# Patient Record
Sex: Female | Born: 1940
Health system: Southern US, Community
[De-identification: ages and names within clinical notes are randomized; demographics above are authoritative.]

## PROBLEM LIST (undated history)

## (undated) DIAGNOSIS — E538 Deficiency of other specified B group vitamins: Secondary | ICD-10-CM

## (undated) HISTORY — DX: Deficiency of other specified B group vitamins: E53.8

## (undated) HISTORY — PX: NO PAST SURGERIES: SHX2092

---

## 2002-05-25 ENCOUNTER — Ambulatory Visit (HOSPITAL_COMMUNITY): Admission: RE | Admit: 2002-05-25 | Discharge: 2002-05-25 | Payer: Self-pay | Admitting: *Deleted

## 2006-08-16 ENCOUNTER — Other Ambulatory Visit: Admission: RE | Admit: 2006-08-16 | Discharge: 2006-08-16 | Payer: Self-pay | Admitting: *Deleted

## 2009-01-07 ENCOUNTER — Encounter: Admission: RE | Admit: 2009-01-07 | Discharge: 2009-01-07 | Payer: Self-pay | Admitting: Obstetrics and Gynecology

## 2009-02-25 ENCOUNTER — Encounter: Admission: RE | Admit: 2009-02-25 | Discharge: 2009-02-25 | Payer: Self-pay | Admitting: General Surgery

## 2009-12-24 ENCOUNTER — Encounter: Admission: RE | Admit: 2009-12-24 | Discharge: 2009-12-24 | Payer: Self-pay | Admitting: Family Medicine

## 2011-01-13 ENCOUNTER — Encounter
Admission: RE | Admit: 2011-01-13 | Discharge: 2011-01-13 | Payer: Self-pay | Source: Home / Self Care | Attending: Family Medicine | Admitting: Family Medicine

## 2011-12-08 ENCOUNTER — Other Ambulatory Visit: Payer: Self-pay | Admitting: Family Medicine

## 2011-12-08 DIAGNOSIS — Z1231 Encounter for screening mammogram for malignant neoplasm of breast: Secondary | ICD-10-CM

## 2012-01-17 ENCOUNTER — Ambulatory Visit
Admission: RE | Admit: 2012-01-17 | Discharge: 2012-01-17 | Disposition: A | Discharge status: Home / Self Care | Payer: Medicare Other | Source: Ambulatory Visit | Attending: Family Medicine | Admitting: Family Medicine

## 2012-01-17 DIAGNOSIS — Z1231 Encounter for screening mammogram for malignant neoplasm of breast: Secondary | ICD-10-CM

## 2012-02-10 ENCOUNTER — Other Ambulatory Visit: Payer: Self-pay | Admitting: Oncology

## 2012-09-08 ENCOUNTER — Other Ambulatory Visit: Payer: Self-pay | Admitting: Oncology

## 2012-12-28 ENCOUNTER — Other Ambulatory Visit: Payer: Self-pay | Admitting: Family Medicine

## 2012-12-28 DIAGNOSIS — Z1231 Encounter for screening mammogram for malignant neoplasm of breast: Secondary | ICD-10-CM

## 2013-01-29 ENCOUNTER — Ambulatory Visit
Admission: RE | Admit: 2013-01-29 | Discharge: 2013-01-29 | Disposition: A | Discharge status: Home / Self Care | Payer: Medicare Other | Source: Ambulatory Visit | Attending: Family Medicine | Admitting: Family Medicine

## 2013-01-29 DIAGNOSIS — Z1231 Encounter for screening mammogram for malignant neoplasm of breast: Secondary | ICD-10-CM

## 2013-12-26 ENCOUNTER — Other Ambulatory Visit: Payer: Self-pay

## 2013-12-26 DIAGNOSIS — Z1231 Encounter for screening mammogram for malignant neoplasm of breast: Secondary | ICD-10-CM

## 2014-01-30 ENCOUNTER — Ambulatory Visit
Admission: RE | Admit: 2014-01-30 | Discharge: 2014-01-30 | Disposition: A | Discharge status: Home / Self Care | Payer: 59 | Source: Ambulatory Visit

## 2014-01-30 DIAGNOSIS — Z1231 Encounter for screening mammogram for malignant neoplasm of breast: Secondary | ICD-10-CM

## 2014-12-09 ENCOUNTER — Other Ambulatory Visit: Payer: Self-pay

## 2014-12-09 DIAGNOSIS — Z1231 Encounter for screening mammogram for malignant neoplasm of breast: Secondary | ICD-10-CM

## 2015-02-04 ENCOUNTER — Ambulatory Visit: Payer: 59

## 2015-02-06 ENCOUNTER — Ambulatory Visit
Admission: RE | Admit: 2015-02-06 | Discharge: 2015-02-06 | Disposition: A | Discharge status: Home / Self Care | Payer: Medicare Other | Source: Ambulatory Visit

## 2015-02-06 DIAGNOSIS — Z1231 Encounter for screening mammogram for malignant neoplasm of breast: Secondary | ICD-10-CM

## 2015-11-17 ENCOUNTER — Other Ambulatory Visit: Payer: Self-pay

## 2015-11-17 DIAGNOSIS — Z1231 Encounter for screening mammogram for malignant neoplasm of breast: Secondary | ICD-10-CM

## 2016-02-23 ENCOUNTER — Ambulatory Visit
Admission: RE | Admit: 2016-02-23 | Discharge: 2016-02-23 | Disposition: A | Discharge status: Home / Self Care | Payer: Medicare Other | Source: Ambulatory Visit

## 2016-02-23 DIAGNOSIS — Z1231 Encounter for screening mammogram for malignant neoplasm of breast: Secondary | ICD-10-CM

## 2016-02-25 ENCOUNTER — Other Ambulatory Visit: Payer: Self-pay | Admitting: Family Medicine

## 2016-02-25 DIAGNOSIS — R928 Other abnormal and inconclusive findings on diagnostic imaging of breast: Secondary | ICD-10-CM

## 2016-03-03 ENCOUNTER — Ambulatory Visit
Admission: RE | Admit: 2016-03-03 | Discharge: 2016-03-03 | Disposition: A | Discharge status: Home / Self Care | Payer: Medicare Other | Source: Ambulatory Visit | Attending: Family Medicine | Admitting: Family Medicine

## 2016-03-03 DIAGNOSIS — R928 Other abnormal and inconclusive findings on diagnostic imaging of breast: Secondary | ICD-10-CM

## 2016-08-30 ENCOUNTER — Encounter: Payer: Self-pay | Admitting: Podiatry

## 2016-08-30 ENCOUNTER — Ambulatory Visit (INDEPENDENT_AMBULATORY_CARE_PROVIDER_SITE_OTHER): Payer: Medicare Other

## 2016-08-30 ENCOUNTER — Ambulatory Visit (INDEPENDENT_AMBULATORY_CARE_PROVIDER_SITE_OTHER): Payer: Medicare Other | Admitting: Podiatry

## 2016-08-30 VITALS — BP 119/73 | HR 78 | Resp 16 | Ht 61.0 in | Wt 125.0 lb

## 2016-08-30 DIAGNOSIS — L84 Corns and callosities: Secondary | ICD-10-CM | POA: Diagnosis not present

## 2016-08-30 DIAGNOSIS — M79671 Pain in right foot: Secondary | ICD-10-CM | POA: Diagnosis not present

## 2016-08-30 DIAGNOSIS — D169 Benign neoplasm of bone and articular cartilage, unspecified: Secondary | ICD-10-CM | POA: Diagnosis not present

## 2016-08-30 NOTE — Progress Notes (Signed)
   Subjective:    Patient ID: Shannon Whitaker, female    DOB: 1941/06/10, 75 y.o.   MRN: [id number]  HPI  Chief Complaint  Patient presents with  . Toe Pain    R 5th toe painful lesion rubbing 4th toe.         Review of Systems  All other systems reviewed and are negative.      Objective:   Physical Exam        Assessment & Plan:

## 2016-08-31 NOTE — Progress Notes (Signed)
Subjective:     Patient ID: Shannon Whitaker, female   DOB: Apr 09, 1941, 75 y.o.   MRN: [id number]  HPI patient presents stating she has a painful lesion on the right fifth digit the makes it hard to wear shoe gear and she knows the toe is rotated she had one fixed on her left and she no she needs this one done at one point in future   Review of Systems  All other systems reviewed and are negative.      Objective:   Physical Exam  Constitutional: She is oriented to person, place, and time.  Cardiovascular: Intact distal pulses.   Abdominal: Rebound: neurovascular status intact muscle strength adequate range of motion within normal limits with patient not to have a distal medial keratotic lesion right fifth toe that's painful when pressed with slight rotation of the toe against the fourth toe. Patient   Musculoskeletal: Normal range of motion.  Neurological: She is oriented to person, place, and time.  Skin: Skin is warm.  Nursing note and vitals reviewed.  states it's gradually getting worse and was noted to have good digital perfusion and is well oriented 3     Assessment:     Exostotic lesion fifth digit right with also patient noted to have rotated fifth toe    Plan:     H&P x-rays reviewed and debridement accomplished with padding. Discussed ultimate exostectomy and reviewed procedure and recovery and she wants to get this done but needs to wait several months to do this  X-ray report indicated rotation of the fifth toe significant nature with distal medial keratotic tissue and exostotic lesion

## 2017-01-25 ENCOUNTER — Other Ambulatory Visit: Payer: Self-pay | Admitting: Family Medicine

## 2017-01-25 DIAGNOSIS — Z1231 Encounter for screening mammogram for malignant neoplasm of breast: Secondary | ICD-10-CM

## 2017-02-25 ENCOUNTER — Ambulatory Visit
Admission: RE | Admit: 2017-02-25 | Discharge: 2017-02-25 | Disposition: A | Discharge status: Home / Self Care | Payer: Medicare Other | Source: Ambulatory Visit | Attending: Family Medicine | Admitting: Family Medicine

## 2017-02-25 DIAGNOSIS — Z1231 Encounter for screening mammogram for malignant neoplasm of breast: Secondary | ICD-10-CM

## 2017-05-13 ENCOUNTER — Ambulatory Visit (INDEPENDENT_AMBULATORY_CARE_PROVIDER_SITE_OTHER): Payer: Medicare Other | Admitting: Podiatry

## 2017-05-13 ENCOUNTER — Encounter: Payer: Self-pay | Admitting: Podiatry

## 2017-05-13 DIAGNOSIS — L84 Corns and callosities: Secondary | ICD-10-CM | POA: Diagnosis not present

## 2017-05-13 DIAGNOSIS — D169 Benign neoplasm of bone and articular cartilage, unspecified: Secondary | ICD-10-CM | POA: Diagnosis not present

## 2017-05-14 NOTE — Progress Notes (Signed)
Subjective:    Patient ID: Shannon Whitaker, female   DOB: 76 y.o.   MRN: [id number]   HPI patient states what she did for me last time was quite a bit better but I'm still having discomfort especially when I wear tighter shoes not as bad now been wearing sandals    ROS      Objective:  Physical Exam neurovascular status intact with patient's fifth digit right showing medial keratotic lesion that's painful and several other lesions that are painful on both feet with an irritated fourth nail medial side     Assessment:    Keratotic lesion digit 5 right with exostotic lesion and ingrown toenail left fourth toe     Plan:    H&P conditions reviewed debridement accomplished removed a small bit at the medial nail and may require permanent procedure if it persists or returns and will be seen back and understands someday she may need exostectomy fifth digit right which I educated her on

## 2017-12-02 ENCOUNTER — Other Ambulatory Visit: Payer: Self-pay | Admitting: Family Medicine

## 2017-12-02 DIAGNOSIS — Z1231 Encounter for screening mammogram for malignant neoplasm of breast: Secondary | ICD-10-CM

## 2017-12-23 DIAGNOSIS — M25562 Pain in left knee: Secondary | ICD-10-CM | POA: Insufficient documentation

## 2018-02-08 ENCOUNTER — Ambulatory Visit: Payer: Medicare Other | Admitting: Podiatry

## 2018-02-08 ENCOUNTER — Encounter: Payer: Self-pay | Admitting: Podiatry

## 2018-02-08 ENCOUNTER — Ambulatory Visit (INDEPENDENT_AMBULATORY_CARE_PROVIDER_SITE_OTHER): Payer: Medicare Other

## 2018-02-08 DIAGNOSIS — L84 Corns and callosities: Secondary | ICD-10-CM

## 2018-02-08 DIAGNOSIS — M2041 Other hammer toe(s) (acquired), right foot: Secondary | ICD-10-CM

## 2018-02-08 DIAGNOSIS — D169 Benign neoplasm of bone and articular cartilage, unspecified: Secondary | ICD-10-CM | POA: Diagnosis not present

## 2018-02-08 NOTE — Progress Notes (Signed)
Subjective:   Patient ID: Shannon Whitaker, female   DOB: 77 y.o.   MRN: [id number]   HPI Patient presents stating that she is developed corns fourth and fifth digits right foot which are sore and she is not sure if she is getting need surgery   ROS      Objective:  Physical Exam  Neurovascular status intact with digital deformities digits 4 5 right with exostotic lesion hammertoe deformity and chronic keratotic tissue formation     Assessment:  Hammertoe deformity exostosis with chronic lesion formation     Plan:  H&P condition reviewed and today sharp debridement accomplished with no iatrogenic bleeding noted.  Patient had padding applied and we discussed hammertoe arthroplasty and exostectomy which may be necessary and patient will return when it is sore and at this time the left foot does not require any form of aggressive treatment  X-rays indicate significant structural malalignment of the fifth digit bilateral pressing against the fourth toe bilateral

## 2018-02-27 ENCOUNTER — Ambulatory Visit
Admission: RE | Admit: 2018-02-27 | Discharge: 2018-02-27 | Disposition: A | Discharge status: Home / Self Care | Payer: Medicare Other | Source: Ambulatory Visit | Attending: Family Medicine | Admitting: Family Medicine

## 2018-02-27 DIAGNOSIS — Z1231 Encounter for screening mammogram for malignant neoplasm of breast: Secondary | ICD-10-CM

## 2018-04-28 ENCOUNTER — Other Ambulatory Visit: Payer: Self-pay | Admitting: Orthopedic Surgery

## 2018-04-28 DIAGNOSIS — M79662 Pain in left lower leg: Secondary | ICD-10-CM

## 2018-05-02 NOTE — Progress Notes (Signed)
Phone call to patient to verify medication list and allergies. Pt reports she occasionally takes trazodone for sleeping. Pt informed she will need to stop Trazodone 48hrs prior to myelogram appointment time. Pt verbalized understanding.

## 2018-05-16 ENCOUNTER — Other Ambulatory Visit: Payer: Medicare Other

## 2018-05-19 ENCOUNTER — Ambulatory Visit
Admission: RE | Admit: 2018-05-19 | Discharge: 2018-05-19 | Disposition: A | Discharge status: Home / Self Care | Payer: Medicare Other | Source: Ambulatory Visit | Attending: Orthopedic Surgery | Admitting: Orthopedic Surgery

## 2018-05-19 DIAGNOSIS — M79662 Pain in left lower leg: Secondary | ICD-10-CM

## 2018-05-19 MED ORDER — DIAZEPAM 5 MG PO TABS: 5.0000 mg | ORAL_TABLET | Freq: Once | ORAL | Status: DC

## 2018-05-19 MED ORDER — IOPAMIDOL (ISOVUE-M 200) INJECTION 41%
15.0000 mL | Freq: Once | INTRAMUSCULAR | Status: AC
  Administered 2018-05-19: 15 mL via INTRATHECAL

## 2018-05-19 NOTE — Discharge Instructions (Signed)
Myelogram Discharge Instructions  1. Go home and rest quietly for the next 24 hours.  It is important to lie flat for the next 24 hours.  Get up only to go to the restroom.  You may lie in the bed or on a couch on your back, your stomach, your left side or your right side.  You may have one pillow under your head.  You may have pillows between your knees while you are on your side or under your knees while you are on your back.  2. DO NOT drive today.  Recline the seat as far back as it will go, while still wearing your seat belt, on the way home.  3. You may get up to go to the bathroom as needed.  You may sit up for 10 minutes to eat.  You may resume your normal diet and medications unless otherwise indicated.  Drink lots of extra fluids today and tomorrow.  4. The incidence of headache, nausea, or vomiting is about 5% (one in 20 patients).  If you develop a headache, lie flat and drink plenty of fluids until the headache goes away.  Caffeinated beverages may be helpful.  If you develop severe nausea and vomiting or a headache that does not go away with flat bed rest, call [phone].  5. You may resume normal activities after your 24 hours of bed rest is over; however, do not exert yourself strongly or do any heavy lifting tomorrow. If when you get up you have a headache when standing, go back to bed and force fluids for another 24 hours.  6. Call your physician for a follow-up appointment.  The results of your myelogram will be sent directly to your physician by the following day.  7. If you have any questions or if complications develop after you arrive home, please call [phone].  Discharge instructions have been explained to the patient.  The patient, or the person responsible for the patient, fully understands these instructions.       MAY RESUME TRAZODONE ON May 20, 2018, AFTER 9:30 AM.

## 2018-12-07 ENCOUNTER — Ambulatory Visit: Payer: Medicare Other | Admitting: Podiatry

## 2018-12-07 DIAGNOSIS — B351 Tinea unguium: Secondary | ICD-10-CM | POA: Diagnosis not present

## 2018-12-07 DIAGNOSIS — L84 Corns and callosities: Secondary | ICD-10-CM | POA: Diagnosis not present

## 2018-12-07 DIAGNOSIS — M79675 Pain in left toe(s): Secondary | ICD-10-CM

## 2018-12-07 DIAGNOSIS — M79674 Pain in right toe(s): Secondary | ICD-10-CM | POA: Diagnosis not present

## 2018-12-07 NOTE — Patient Instructions (Signed)
Corns and Calluses Corns are small areas of thickened skin that occur on the top, sides, or tip of a toe. They contain a cone-shaped core with a point that can press on a nerve below. This causes pain.  Calluses are areas of thickened skin that can occur anywhere on the body, including the hands, fingers, palms, soles of the feet, and heels. Calluses are usually larger than corns. What are the causes? Corns and calluses are caused by rubbing (friction) or pressure, such as from shoes that are too tight or do not fit properly. What increases the risk? Corns are more likely to develop in people who have misshapen toes (toe deformities), such as hammer toes. Calluses can occur with friction to any area of the skin. They are more likely to develop in people who:  Work with their hands.  Wear shoes that fit poorly, are too tight, or are high-heeled.  Have toe deformities. What are the signs or symptoms? Symptoms of a corn or callus include:  A hard growth on the skin.  Pain or tenderness under the skin.  Redness and swelling.  Increased discomfort while wearing tight-fitting shoes, if your feet are affected. If a corn or callus becomes infected, symptoms may include:  Redness and swelling that gets worse.  Pain.  Fluid, blood, or pus draining from the corn or callus. How is this diagnosed? Corns and calluses may be diagnosed based on your symptoms, your medical history, and a physical exam. How is this treated? Treatment for corns and calluses may include:  Removing the cause of the friction or pressure. This may involve: ? Changing your shoes. ? Wearing shoe inserts (orthotics) or other protective layers in your shoes, such as a corn pad. ? Wearing gloves.  Applying medicine to the skin (topical medicine) to help soften skin in the hardened, thickened areas.  Removing layers of dead skin with a file to reduce the size of the corn or callus.  Removing the corn or callus with a  scalpel or laser.  Taking antibiotic medicines, if your corn or callus is infected.  Having surgery, if a toe deformity is the cause. Follow these instructions at home:   Take over-the-counter and prescription medicines only as told by your health care provider.  If you were prescribed an antibiotic, take it as told by your health care provider. Do not stop taking it even if your condition starts to improve.  Wear shoes that fit well. Avoid wearing high-heeled shoes and shoes that are too tight or too loose.  Wear any padding, protective layers, gloves, or orthotics as told by your health care provider.  Soak your hands or feet and then use a file or pumice stone to soften your corn or callus. Do this as told by your health care provider.  Check your corn or callus every day for symptoms of infection. Contact a health care provider if you:  Notice that your symptoms do not improve with treatment.  Have redness or swelling that gets worse.  Notice that your corn or callus becomes painful.  Have fluid, blood, or pus coming from your corn or callus.  Have new symptoms. Summary  Corns are small areas of thickened skin that occur on the top, sides, or tip of a toe.  Calluses are areas of thickened skin that can occur anywhere on the body, including the hands, fingers, palms, and soles of the feet. Calluses are usually larger than corns.  Corns and calluses are caused by   rubbing (friction) or pressure, such as from shoes that are too tight or do not fit properly.  Treatment may include wearing any padding, protective layers, gloves, or orthotics as told by your health care provider. This information is not intended to replace advice given to you by your health care provider. Make sure you discuss any questions you have with your health care provider. Document Released: 09/11/2004 Document Revised: 10/19/2017 Document Reviewed: 10/19/2017 Elsevier Interactive Patient Education   2019 Elsevier Inc.  Onychomycosis/Fungal Toenails  WHAT IS IT? An infection that lies within the keratin of your nail plate that is caused by a fungus.  WHY ME? Fungal infections affect all ages, sexes, races, and creeds.  There may be many factors that predispose you to a fungal infection such as age, coexisting medical conditions such as diabetes, or an autoimmune disease; stress, medications, fatigue, genetics, etc.  Bottom line: fungus thrives in a warm, moist environment and your shoes offer such a location.  IS IT CONTAGIOUS? Theoretically, yes.  You do not want to share shoes, nail clippers or files with someone who has fungal toenails.  Walking around barefoot in the same room or sleeping in the same bed is unlikely to transfer the organism.  It is important to realize, however, that fungus can spread easily from one nail to the next on the same foot.  HOW DO WE TREAT THIS?  There are several ways to treat this condition.  Treatment may depend on many factors such as age, medications, pregnancy, liver and kidney conditions, etc.  It is best to ask your doctor which options are available to you.  1. No treatment.   Unlike many other medical concerns, you can live with this condition.  However for many people this can be a painful condition and may lead to ingrown toenails or a bacterial infection.  It is recommended that you keep the nails cut short to help reduce the amount of fungal nail. 2. Topical treatment.  These range from herbal remedies to prescription strength nail lacquers.  About 40-50% effective, topicals require twice daily application for approximately 9 to 12 months or until an entirely new nail has grown out.  The most effective topicals are medical grade medications available through physicians offices. 3. Oral antifungal medications.  With an 80-90% cure rate, the most common oral medication requires 3 to 4 months of therapy and stays in your system for a year as the new nail  grows out.  Oral antifungal medications do require blood work to make sure it is a safe drug for you.  A liver function panel will be performed prior to starting the medication and after the first month of treatment.  It is important to have the blood work performed to avoid any harmful side effects.  In general, this medication safe but blood work is required. 4. Laser Therapy.  This treatment is performed by applying a specialized laser to the affected nail plate.  This therapy is noninvasive, fast, and non-painful.  It is not covered by insurance and is therefore, out of pocket.  The results have been very good with a 80-95% cure rate.  The [facility] is the only practice in the area to offer this therapy. 5. Permanent Nail Avulsion.  Removing the entire nail so that a new nail will not grow back. 

## 2019-01-11 ENCOUNTER — Encounter: Payer: Self-pay | Admitting: Podiatry

## 2019-01-11 NOTE — Progress Notes (Signed)
Subjective:  Patient presents to clinic with cc of  painful, thick, discolored, elongated toenails 1-5 b/l that become tender and cannot cut because of thickness. Also c/o  painful corns right 4th and 5th digits which are relieved with periodic debridement.  She has discussed surgical options with Dr. Paulla Dolly, but is not ready to proceed yet.  Lawerance Cruel, MD is her PCP.   Current Outpatient Medications:  .  Cyanocobalamin (VITAMIN B-12 IJ), Inject as directed. Gets an injection once a month, Disp: , Rfl:    No Known Allergies   Objective:  Physical Examination: Neurovascular status intact with thick, discolored brittle toenails 1-5 b/l with tenderness to palpation of nail plates.  Adductovarus rotation of 5th digit right distal phalanx with hyperkeratotic lesion noted at medial aspect of digit. Hyperkeratotic lesion lateral aspect of 4th digit PIPJ which abuts 5th digit lesion. Both with tenderness to palpation. No flocculence, no edema, no erythema, no underlying wound.   Assessment: 1. Mycotic nail infection with pain 1-5 b/l 2. 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 Hammertoe 5th digit  Plan: 1. Debride painful toenails 1-5 b/l with no iatrogenic bleeding. 2. Pared painful corns right 4th and 5th digits. Dispensed Silicone toe pads for comfort. Avoid shoe gear with tapered forefoot. 3. Follow up prn

## 2019-01-18 ENCOUNTER — Other Ambulatory Visit: Payer: Self-pay | Admitting: Family Medicine

## 2019-01-18 DIAGNOSIS — Z1231 Encounter for screening mammogram for malignant neoplasm of breast: Secondary | ICD-10-CM

## 2019-03-05 ENCOUNTER — Ambulatory Visit: Payer: Medicare Other

## 2019-03-28 ENCOUNTER — Ambulatory Visit: Payer: Medicare Other

## 2019-05-10 ENCOUNTER — Other Ambulatory Visit: Payer: Self-pay

## 2019-05-10 ENCOUNTER — Ambulatory Visit
Admission: RE | Admit: 2019-05-10 | Discharge: 2019-05-10 | Disposition: A | Discharge status: Home / Self Care | Payer: Medicare Other | Source: Ambulatory Visit | Attending: Family Medicine | Admitting: Family Medicine

## 2019-05-10 DIAGNOSIS — Z1231 Encounter for screening mammogram for malignant neoplasm of breast: Secondary | ICD-10-CM

## 2019-05-15 ENCOUNTER — Other Ambulatory Visit: Payer: Self-pay | Admitting: Family Medicine

## 2019-05-15 DIAGNOSIS — R928 Other abnormal and inconclusive findings on diagnostic imaging of breast: Secondary | ICD-10-CM

## 2019-05-18 ENCOUNTER — Ambulatory Visit: Payer: Medicare Other

## 2019-05-18 ENCOUNTER — Other Ambulatory Visit: Payer: Self-pay

## 2019-05-18 ENCOUNTER — Ambulatory Visit
Admission: RE | Admit: 2019-05-18 | Discharge: 2019-05-18 | Disposition: A | Discharge status: Home / Self Care | Payer: Medicare Other | Source: Ambulatory Visit | Attending: Family Medicine | Admitting: Family Medicine

## 2019-05-18 DIAGNOSIS — R928 Other abnormal and inconclusive findings on diagnostic imaging of breast: Secondary | ICD-10-CM

## 2020-02-20 DIAGNOSIS — L821 Other seborrheic keratosis: Secondary | ICD-10-CM | POA: Diagnosis not present

## 2020-02-20 DIAGNOSIS — D225 Melanocytic nevi of trunk: Secondary | ICD-10-CM | POA: Diagnosis not present

## 2020-02-20 DIAGNOSIS — L820 Inflamed seborrheic keratosis: Secondary | ICD-10-CM | POA: Diagnosis not present

## 2020-02-20 DIAGNOSIS — Z85828 Personal history of other malignant neoplasm of skin: Secondary | ICD-10-CM | POA: Diagnosis not present

## 2020-04-01 DIAGNOSIS — M25562 Pain in left knee: Secondary | ICD-10-CM | POA: Diagnosis not present

## 2020-05-05 ENCOUNTER — Other Ambulatory Visit: Payer: Self-pay | Admitting: Family Medicine

## 2020-05-05 DIAGNOSIS — L03116 Cellulitis of left lower limb: Secondary | ICD-10-CM | POA: Diagnosis not present

## 2020-05-05 DIAGNOSIS — Z1231 Encounter for screening mammogram for malignant neoplasm of breast: Secondary | ICD-10-CM

## 2020-05-12 ENCOUNTER — Ambulatory Visit: Payer: Medicare Other

## 2020-05-14 ENCOUNTER — Ambulatory Visit
Admission: RE | Admit: 2020-05-14 | Discharge: 2020-05-14 | Disposition: A | Discharge status: Home / Self Care | Payer: Medicare PPO | Source: Ambulatory Visit | Attending: Family Medicine | Admitting: Family Medicine

## 2020-05-14 ENCOUNTER — Other Ambulatory Visit: Payer: Self-pay

## 2020-05-14 DIAGNOSIS — Z1231 Encounter for screening mammogram for malignant neoplasm of breast: Secondary | ICD-10-CM

## 2020-06-27 DIAGNOSIS — M533 Sacrococcygeal disorders, not elsewhere classified: Secondary | ICD-10-CM | POA: Diagnosis not present

## 2020-08-20 DIAGNOSIS — K1379 Other lesions of oral mucosa: Secondary | ICD-10-CM | POA: Diagnosis not present

## 2020-08-20 DIAGNOSIS — R0981 Nasal congestion: Secondary | ICD-10-CM | POA: Diagnosis not present

## 2020-08-30 DIAGNOSIS — R030 Elevated blood-pressure reading, without diagnosis of hypertension: Secondary | ICD-10-CM | POA: Diagnosis not present

## 2020-08-30 DIAGNOSIS — Z604 Social exclusion and rejection: Secondary | ICD-10-CM | POA: Diagnosis not present

## 2020-08-30 DIAGNOSIS — Z87891 Personal history of nicotine dependence: Secondary | ICD-10-CM | POA: Diagnosis not present

## 2020-08-30 DIAGNOSIS — Z823 Family history of stroke: Secondary | ICD-10-CM | POA: Diagnosis not present

## 2020-08-30 DIAGNOSIS — Z809 Family history of malignant neoplasm, unspecified: Secondary | ICD-10-CM | POA: Diagnosis not present

## 2020-08-30 DIAGNOSIS — Z8249 Family history of ischemic heart disease and other diseases of the circulatory system: Secondary | ICD-10-CM | POA: Diagnosis not present

## 2020-09-03 DIAGNOSIS — J329 Chronic sinusitis, unspecified: Secondary | ICD-10-CM | POA: Diagnosis not present

## 2020-09-03 DIAGNOSIS — J029 Acute pharyngitis, unspecified: Secondary | ICD-10-CM | POA: Diagnosis not present

## 2020-09-24 DIAGNOSIS — J029 Acute pharyngitis, unspecified: Secondary | ICD-10-CM | POA: Diagnosis not present

## 2020-10-18 DIAGNOSIS — M545 Low back pain, unspecified: Secondary | ICD-10-CM | POA: Diagnosis not present

## 2020-10-30 DIAGNOSIS — Z961 Presence of intraocular lens: Secondary | ICD-10-CM | POA: Diagnosis not present

## 2020-12-04 DIAGNOSIS — J029 Acute pharyngitis, unspecified: Secondary | ICD-10-CM | POA: Diagnosis not present

## 2020-12-04 DIAGNOSIS — R0982 Postnasal drip: Secondary | ICD-10-CM | POA: Diagnosis not present

## 2020-12-25 DIAGNOSIS — J029 Acute pharyngitis, unspecified: Secondary | ICD-10-CM | POA: Diagnosis not present

## 2021-03-30 DIAGNOSIS — E538 Deficiency of other specified B group vitamins: Secondary | ICD-10-CM | POA: Diagnosis not present

## 2021-03-30 DIAGNOSIS — R413 Other amnesia: Secondary | ICD-10-CM | POA: Diagnosis not present

## 2021-04-29 ENCOUNTER — Other Ambulatory Visit: Payer: Self-pay | Admitting: Family Medicine

## 2021-04-29 DIAGNOSIS — Z1231 Encounter for screening mammogram for malignant neoplasm of breast: Secondary | ICD-10-CM

## 2021-05-12 DIAGNOSIS — Z604 Social exclusion and rejection: Secondary | ICD-10-CM | POA: Diagnosis not present

## 2021-05-12 DIAGNOSIS — R413 Other amnesia: Secondary | ICD-10-CM | POA: Diagnosis not present

## 2021-05-12 DIAGNOSIS — E538 Deficiency of other specified B group vitamins: Secondary | ICD-10-CM | POA: Diagnosis not present

## 2021-06-10 ENCOUNTER — Encounter: Payer: Self-pay | Admitting: Neurology

## 2021-06-10 ENCOUNTER — Ambulatory Visit: Payer: Medicare PPO | Admitting: Neurology

## 2021-06-10 VITALS — BP 123/72 | HR 87 | Ht 62.0 in | Wt 99.0 lb

## 2021-06-10 DIAGNOSIS — G3184 Mild cognitive impairment, so stated: Secondary | ICD-10-CM

## 2021-06-10 NOTE — Patient Instructions (Addendum)
MRI of the brain Blood work Formal memory testing for a baseline   Recommendations to prevent or slow progression of cognitive decline:   Exercise You should increase exercise 30 to 45 minutes per day at least 3 days a week although 5 to 7 would be preferred. Any type of exercise (including walking) is acceptable although a recumbent bicycle may be best if you are unsteady. Disease related apathy can be a significant roadblock to exercise and the only way to overcome this is to make it a daily routine and perhaps have a reward at the end (something your loved one loves to eat or drink perhaps) or a personal trainer coming to the home can also be very useful. In general a structured, repetitive schedule is best.   Cardiovascular Health: You should optimize all cardiovascular risk factors (blood pressure, sugar, cholesterol) as vascular disease such as strokes and heart attacks can make memory problems much worse.   Diet: Eating a heart healthy (Mediterranean) diet is also a good idea; fish and poultry instead of red meat, nuts (mostly non-peanuts), vegetables, fruits, olive oil or canola oil (instead of butter), minimal salt (use other spices to flavor foods), whole grain rice, bread, cereal and pasta and wine in moderation.  General Health: Any diseases which effect your body will effect your brain such as a pneumonia, urinary infection, blood clot, heart attack or stroke. Keep contact with your primary care doctor for regular follow ups.  Sleep. A good nights sleep is healthy for the brain. Seven hours is recommended. If you have insomnia or poor sleep habits see the recommendations below  Tips: Structured and consistent daytime and nighttime routine, including regular wake times, bedtimes, and mealtimes, will be important for the patient to avoid confusion. Keeping frequently used items in designated places will help reduce stress from searching. If there are worries about getting lost do not let  the patient leave home unaccompanied. They might benefit from wearing an identification bracelet that will help others assist in finding home if they become lost. Information about nationwide safe return services and other helpful resources may be obtained through the Alzheimer's Association helpline at [phone].  Finances, Power of Producer, television/film/video Directives: You should consider putting legal safeguards in place with regard to financial and medical decision making. While the spouse always has power of attorney for medical and financial issues in the absence of any form, you should consider what you want in case the spouse / caregiver is no longer around or capable of making decisions.   [place] : http://www.welch.com/.pdf  Or Google "Carrier" AND "An Forensic scientist for [employer]  Other States: ApartmentMom.com.ee  The signature on these forms should be notarized.   DRIVING:   Recommend driving evaluation  If you would like to be tested to see if you are driving safely, [facility] has a Clinical Driving Evaluation. To schedule an appointment call [phone].                RESOURCES:  Memory Loss: Improve your short term memory By Silvio Pate  The Alzheimer's Reading Room http://www.alzheimersreadingroom.[email or web address]/   The Alzheimer's Compendium http://www.alzcompend.info/  [employer] www.dukefamilysupport.WER [phone]  Recommended resources for caregivers (All can be purchased on [employer]):  1) A Caregiver's Guide to Dementia: Using Activities and Other Strategies to Prevent, Reduce and Manage Behavioral Symptoms by Osie Bond. Gitlin and [employer]   2) A Caregiver's Guide to [employer] Dementia by Caleen Essex MS BSN  and Gaston Islam   3) What If It's Not Alzheimer's?: A Caregiver's  Guide to Dementia by Koren Shiver (Author), Octaviano Batty (Editor)  3) The 36 hour day by Rabins and Mace  4) Understanding Difficult Behaviors by Merita Norton and White  Online course for helping caregivers reduce stress, guilt and frustration called the Caregivers Helpbook. The website is www.powerfultoolsforcaregivers.org  As a caregiver you are a Art gallery manager. Problems you face as a caregiver are usually unique to your situation and the way your loved-one's disease manifests itself. The best way to use these books is to look at the Table of Contents and read any chapters of interest or that apply to challenges you are having as a caregiver.  NATIONAL RESOURCES: For more information on neurological disorders or research programs funded by the [employer] of Neurological Disorders and Stroke, contact the Institute's Agricultural consultant (BRAIN) at: [place] [address] [phone] (toll-free) MasterBoxes.it  Information on dementia is also available from the following organizations: Alzheimer's Disease Education and Referral ([place]) [facility] [address] [place] [address] [phone] (toll-free) DVDEnthusiasts.nl  Alzheimer's Association [address], [place] [address] [phone] (toll-free, 24-hour helpline) [phone] (TDD) CapitalMile.co.nz  Alzheimer's Foundation of America [address], [place] [address] [phone] (toll-free) www.alzfdn.org  [facility] [address], [place] [address] [phone] www.alzdiscovery.org  Association for [address], [place] [address] [phone] (toll-free) [place]  [address] [phone] (toll-free) www.brightfocus.org/alzheimers  [facility] Stabler [facility] [address] [place] [address] [phone] www.https://lambert-jackson.net/  Lewy Body Dementia Association [address] [place] [address] [phone] (toll-free LBD Caregiver Link) www.lbda.[place], [place], [place] [phone] (toll-free) [phone] [facility]) https://carter.com/  National Organization for Rare Disorders [address] [place] [address] 7-124-580-DXIP [phone]) (toll-free) www.rarediseases.org  The Dementias: Hope Through Research was jointly produced by the [employer] of Neurological Disorders and Stroke (NINDS) and the [employer] on Aging (NIA), both part of the [employer], the [employer] research agency--supporting scientific studies that turn discovery into health. NINDS is the nation's leading funder of research on the brain and nervous system. The NINDS mission is to reduce the burden of neurological disease. For more information and resources, visit MasterBoxes.it [1] or call 6067815150. NIA leads the federal government effort conducting and supporting research on aging and the health and well-being of older people. NIA's Alzheimer's Disease Education and Referral (ADEAR) Center offers information and publications on dementia and caregiving for families, caregivers, and professionals. For more information, visit DVDEnthusiasts.nl [2] or call ([phone]. Also available from NIA are publications and information about Alzheimer's disease as well as the booklets Frontotemporal Disorders: Information for Patients, Families, and Caregivers and Lewy Body Dementia: Information for Patients, Families, and Professionals. Source URL:  SocialSpecialists.co.nz

## 2021-06-10 NOTE — Progress Notes (Signed)
[facility]    Provider:  Dr Jaynee Eagles Requesting Provider: Lawerance Cruel, MD Primary Care Provider:  Lawerance Cruel, MD  CC:  memory loss  HPI:  Shannon Whitaker is a 80 y.o. female here as requested by Lawerance Cruel, MD for memory loss. PMHx osteopenia, high cholesterol, vitamin B12 deficiency, pernicious anemia, memory changes, sleep disturbance, osteoarthritis, allergies and bacterial sinusitis.  I reviewed Dr. Alan Ripper notes, she is on B12 IM monthly injections.  There are concerns from her neighbor who lives next door to her, neighbor talked at great length about this patient with dementia, the neighbor seems distressed about her care, neighbor discussed his concerns with [name] and reported that patient is very forgetful, the neighbor also mentioned that patient has no family and that he can help her but is very concerned about her and feels she needs more extensive help, neighbor mentioned several times to her healthcare POA that she needs to go to the doctor and she is also not getting the help she needs, neighbor states she is driving herself to the grocery store and she does not go very far but sometimes gets to a stop sign and has trouble remembering where to go, neighbor discussed issue with Dr. Harrington Challenger verbally.  Dr. Alan Ripper office called patient and patient came in for appointment with her neighbor who is been her neighbor for 20 years, patient stated she was feeling nervous inside about leaving the house, wonders where she is going when driving, discussed taxis, reportedly repeatedly asking about varieties and, does not remember what food she eats, forgetting where stores are sometimes.  She is a former smoker quit 20 years ago, Dr. Harrington Challenger collected blood for CBC with differential, B12, CBC was normal (reviewed labs), however vitamin B12 was 159 (which is extremely decreased).  Patient is here with cousin with POA, reported she plans on moving into [place] soon, feeling  isolated and loneliness are at the root of her issues, and moving could greatly improve that, mention senior centers but she did not express interest there, Dr. Harrington Challenger helped her with some paperwork, she reported this to Dr. Harrington Challenger as well on appointment in May 2022. She says she is tired of people being in her business. She doesn't know the date or the day of the month, she taught school for 45 years, she says she will still read the paper and she knows where to look for the date and day. She is moving to [facility]. Their Aunt had dementia and lived with her and her cousin. Grandmother may have had dementia but unclear. Her cousin says she doesn't remember doctor's appointments, but more short-term memory, appointments or names or what she did recently no accidents in the home or accidents in the car, she does not know [place] like she used to, no problems driving within a few miles of home and that is all she needs. She is not missing any bills, not taking medications. She repeats the same questions but patient says she does that for emphasis and not that she forgot. She has a desk calendar and keeps all her information. She puts post-it notes everywhere.   Reviewed notes, labs and imaging from outside physicians, which showed see above  Review of Systems: Patient complains of symptoms per HPI as well as the following symptoms short-term memory loss. Pertinent negatives and positives per HPI. All others negative.   Social History   Socioeconomic History   Marital status: Single  Spouse name: Not on file   Number of children: Not on file   Years of education: Not on file   Highest education level: Not on file  Occupational History   Not on file  Tobacco Use   Smoking status: Former    Pack years: 0.00   Smokeless tobacco: Never  Substance and Sexual Activity   Alcohol use: Not on file   Drug use: Not on file   Sexual activity: Not on file  Other Topics Concern   Not on file  Social  History Narrative   Not on file   Social Determinants of Health   Financial Resource Strain: Not on file  Food Insecurity: Not on file  Transportation Needs: Not on file  Physical Activity: Not on file  Stress: Not on file  Social Connections: Not on file  Intimate Partner Violence: Not on file    Family History  Problem Relation Age of Onset   Dementia Maternal Grandmother    Dementia Other     Past Medical History:  Diagnosis Date   B12 deficiency     Patient Active Problem List   Diagnosis Date Noted   Mild cognitive impairment with memory loss 06/10/2021   Pain in left knee 12/23/2017    Past Surgical History:  Procedure Laterality Date   NO PAST SURGERIES      Current Outpatient Medications  Medication Sig Dispense Refill   Cyanocobalamin (VITAMIN B-12 IJ) Inject as directed. Gets an injection once a month     No current facility-administered medications for this visit.    Allergies as of 06/10/2021   (No Known Allergies)    Vitals: BP 123/72   Pulse 87   Ht 5\' 2"  (1.575 m)   Wt 99 lb (44.9 kg)   BMI 18.11 kg/m  Last Weight:  Wt Readings from Last 1 Encounters:  06/10/21 99 lb (44.9 kg)   Last Height:   Ht Readings from Last 1 Encounters:  06/10/21 5\' 2"  (1.575 m)     Physical exam: Exam: Gen: NAD, conversant                CV: No peripheral edema, warm, nontender Eyes: Conjunctivae clear without exudates or hemorrhage  Neuro: Detailed Neurologic Exam  Speech:    Speech is normal; fluent and spontaneous with normal comprehension.  Cognition: MMSE - Mini Mental State Exam 06/10/2021  Orientation to time 2  Orientation to Place 4  Registration 3  Attention/ Calculation 3  Recall 0  Language- name 2 objects 2  Language- repeat 1  Language- follow 3 step command 2  Language- read & follow direction 1  Write a sentence 1  Copy design 0  Total score 19    Cranial Nerves:  MMSE - Mini Mental State Exam 06/10/2021  Orientation  to time 2  Orientation to Place 4  Registration 3  Attention/ Calculation 3  Recall 0  Language- name 2 objects 2  Language- repeat 1  Language- follow 3 step command 2  Language- read & follow direction 1  Write a sentence 1  Copy design 0  Total score 19       The pupils are equal, round, and reactive to light. Tpupils too small to visiualize fundi. Visual fields are full. Extraocular movements are intact.the muscles of mastication appear  normal. The face is symmetric. Hearing intact to voice. Voice is normal.    Coordination:    Normal   Gait:    normal.  Motor Observation:    No asymmetry, no atrophy, and no involuntary movements noted. Tone:    Normal muscle tone.    Posture:    Posture is normal. normal erect    Strength:    Moving all extremities equally, no focal deficit notes     Sensation: intact to LT        Assessment/Plan:  80 y.o. female here as requested by Lawerance Cruel, MD for memory loss. PMHx osteopenia, high cholesterol, vitamin B12 deficiency, pernicious anemia, memory changes, sleep disturbance, osteoarthritis, allergies and bacterial sinusitis.  I reviewed Dr. Alan Ripper notes, she is on B12 IM monthly injections.   She is irritable, also very defensive and suspicious side of why we would like this testing. Her MMSE is 19/30.  Luckily she is moving to [facility]. Her cousin states he is very careful not to upset her because she is very defensive. She appears to have very poor insight into her memory changes and she repeats questions today and have some difficulty understanding aspects of our conversation. Slightly difficult appointment and extended.  - continue B12 injections for life.   Orders Placed This Encounter  Procedures   MR BRAIN W WO CONTRAST   Comprehensive metabolic panel   Vitamin B1   Homocysteine   Vitamin D, 25-hydroxy   TSH   Ambulatory referral to Neuropsychology     Cc: Lawerance Cruel, MD,  Lawerance Cruel,  MD  Sarina Ill, MD  [facility] [address] [place], [place] [address]  Phone [phone] Fax [phone]  I spent over 60 minutes of face-to-face and non-face-to-face time with patient on the  1. Mild cognitive impairment with memory loss    diagnosis.  This included previsit chart review, lab review, study review, order entry, electronic health record documentation, patient education on the different diagnostic and therapeutic options, counseling and coordination of care, risks and benefits of management, compliance, or risk factor reduction

## 2021-06-11 ENCOUNTER — Telehealth: Payer: Self-pay | Admitting: Neurology

## 2021-06-11 NOTE — Telephone Encounter (Signed)
-----   Message from Melvenia Beam, MD sent at 06/11/2021  8:21 AM EDT ----- Vitamin D is extremely low(17). TSH normal. Still awaiting other labs. She needs to start taking 1000-2000 daily vitamin D orally daily and follow up with Dr. Harrington Challenger non emergently. Please forward results to Dr. Harrington Challenger thanks

## 2021-06-11 NOTE — Telephone Encounter (Signed)
Called to discuss the lab results. There was no answer. LVM asking for a call back.

## 2021-06-15 ENCOUNTER — Telehealth: Payer: Self-pay | Admitting: Neurology

## 2021-06-15 DIAGNOSIS — G3184 Mild cognitive impairment, so stated: Secondary | ICD-10-CM

## 2021-06-15 LAB — COMPREHENSIVE METABOLIC PANEL
ALT: 11 IU/L (ref 0–32)
AST: 13 IU/L (ref 0–40)
Albumin/Globulin Ratio: 2.5 — ABNORMAL HIGH (ref 1.2–2.2)
Albumin: 5 g/dL — ABNORMAL HIGH (ref 3.7–4.7)
Alkaline Phosphatase: 83 IU/L (ref 44–121)
BUN/Creatinine Ratio: 16 (ref 12–28)
BUN: 11 mg/dL (ref 8–27)
Bilirubin Total: 0.8 mg/dL (ref 0.0–1.2)
CO2: 23 mmol/L (ref 20–29)
Calcium: 9.5 mg/dL (ref 8.7–10.3)
Chloride: 101 mmol/L (ref 96–106)
Creatinine, Ser: 0.67 mg/dL (ref 0.57–1.00)
Globulin, Total: 2 g/dL (ref 1.5–4.5)
Glucose: 93 mg/dL (ref 65–99)
Potassium: 4.2 mmol/L (ref 3.5–5.2)
Sodium: 142 mmol/L (ref 134–144)
Total Protein: 7 g/dL (ref 6.0–8.5)
eGFR: 89 mL/min/{1.73_m2} (ref >59)

## 2021-06-15 LAB — HOMOCYSTEINE: Homocysteine: 17.1 umol/L (ref 0.0–19.2)

## 2021-06-15 LAB — VITAMIN B1: Thiamine: 147.1 nmol/L (ref 66.5–200.0)

## 2021-06-15 LAB — VITAMIN D 25 HYDROXY (VIT D DEFICIENCY, FRACTURES): Vit D, 25-Hydroxy: 17 ng/mL — ABNORMAL LOW (ref 30.0–100.0)

## 2021-06-15 LAB — TSH: TSH: 1.44 u[IU]/mL (ref 0.450–4.500)

## 2021-06-15 NOTE — Telephone Encounter (Signed)
Pt's Shannon Whitaker (POA on DPR) called, receive message from [facility] neurology and [place] for a test. Which one do I schedule an appt with. Would like a call from the nurse.

## 2021-06-15 NOTE — Telephone Encounter (Signed)
Pt was referred to [facility] for formal memory testing. She should proceed with that and follow-up here as already scheduled for December.

## 2021-06-15 NOTE — Telephone Encounter (Signed)
Ms. Shannon Whitaker called, Shannon Whitaker would not be able to schedule her until late October. Are there any other options? If she has to wait another 4 months she will lose her room and deposit at [facility]. Would like a call from the nurse.

## 2021-06-15 NOTE — Telephone Encounter (Signed)
I called Ronald back and LVM with office number for call back. When he calls back, please let him know that Dr Jaynee Eagles referred the patient to [facility] specifically for the formal memory testing so he should schedule with them for those appointments. Also, the MRI brain is pending for June 29th. Patient should proceed with that as well, then follow-up with Dr Jaynee Eagles as previously scheduled on December 28th at 10:30 AM.

## 2021-06-15 NOTE — Telephone Encounter (Signed)
Roque Cash called again today regarding this patient stating he received messages from [facility] neurology and [facility] neurology and didn't know who to call to set up an appointment. I let him know this is Dr. Cathren Laine office at [facility]. Dr. Jaynee Eagles referred her to [facility] neurology to have formal memory testing done and he should call them back to set that up and the MRI has already been scheduled for 6/29 and to keep that appointment. Let him know to call us back if he has any questions

## 2021-06-16 NOTE — Telephone Encounter (Signed)
Spoke with Dr Jaynee Eagles. [facility] is an option but unsure how long the wait will be and they would have to drive there. I spoke with Jori Moll. He is willing to have pt referred there as well to see how quickly they can get in. Referral placed to [facility].

## 2021-06-16 NOTE — Addendum Note (Signed)
Addended by: Gildardo Griffes on: 06/16/2021 02:31 PM   Modules accepted: Orders

## 2021-06-17 ENCOUNTER — Encounter: Payer: Self-pay | Admitting: Neurology

## 2021-06-17 ENCOUNTER — Ambulatory Visit
Admission: RE | Admit: 2021-06-17 | Discharge: 2021-06-17 | Disposition: A | Discharge status: Home / Self Care | Payer: Medicare PPO | Source: Ambulatory Visit | Attending: Neurology | Admitting: Neurology

## 2021-06-17 ENCOUNTER — Other Ambulatory Visit: Payer: Self-pay

## 2021-06-17 DIAGNOSIS — G3184 Mild cognitive impairment, so stated: Secondary | ICD-10-CM

## 2021-06-17 MED ORDER — GADOBENATE DIMEGLUMINE 529 MG/ML IV SOLN
10.0000 mL | Freq: Once | INTRAVENOUS | Status: AC | PRN
  Administered 2021-06-17: 10 mL via INTRAVENOUS

## 2021-06-17 NOTE — Telephone Encounter (Signed)
I called [facility] @ [phone]. The receptionist states they are scheduling in August. She advised me to fax the referral to [phone]. They will call Mr. Shannon Whitaker to schedule patient. I called Mr. Shannon Whitaker to let him know. Provided him with their number and asked him to call them or our office if he does not hear from them by the end of next week.

## 2021-06-23 ENCOUNTER — Telehealth: Payer: Self-pay | Admitting: *Deleted

## 2021-06-23 NOTE — Telephone Encounter (Signed)
-----   Message from Melvenia Beam, MD sent at 06/18/2021  9:13 AM EDT ----- MRI of the brain is unremarkable for age. Many of the findings seen can be normal in aging. However this does not rule out dementia/cognitive problems, please continue to formal memory testing as discussed.

## 2021-06-23 NOTE — Telephone Encounter (Signed)
Spoke with Shannon Whitaker (on DPR) and discussed MRI brain results. He verbalized understanding and his questions were answered. He is aware we need the formal memory testing for a good baseline assessment.

## 2021-06-26 ENCOUNTER — Ambulatory Visit
Admission: RE | Admit: 2021-06-26 | Discharge: 2021-06-26 | Disposition: A | Discharge status: Home / Self Care | Payer: Medicare PPO | Source: Ambulatory Visit | Attending: Family Medicine | Admitting: Family Medicine

## 2021-06-26 ENCOUNTER — Other Ambulatory Visit: Payer: Self-pay

## 2021-06-26 DIAGNOSIS — Z1231 Encounter for screening mammogram for malignant neoplasm of breast: Secondary | ICD-10-CM | POA: Diagnosis not present

## 2021-07-09 ENCOUNTER — Encounter: Payer: Self-pay | Admitting: Counselor

## 2021-07-24 ENCOUNTER — Encounter: Payer: Self-pay | Admitting: Counselor

## 2021-08-05 ENCOUNTER — Non-Acute Institutional Stay: Payer: Self-pay | Admitting: Nurse Practitioner

## 2021-08-05 ENCOUNTER — Encounter: Payer: Self-pay | Admitting: Nurse Practitioner

## 2021-08-05 DIAGNOSIS — G3184 Mild cognitive impairment, so stated: Secondary | ICD-10-CM

## 2021-08-05 NOTE — Progress Notes (Signed)
Location:   [place] Room Number: [place] of Service:  [facility] [phone]) Provider:  Alanis Clift Otho Darner, NP  Lawerance Cruel, MD  Patient Care Team: Lawerance Cruel, MD as PCP - General (Family Medicine)  Extended Emergency Contact Information Primary Emergency Contact: Roque Cash Mobile Phone: [phone] Relation: Relative  Code Status:  FULL CODE Goals of care: Advanced Directive information Advanced Directives 08/05/2021  Does Patient Have a Medical Advance Directive? No  Would patient like information on creating a medical advance directive? No - Patient declined     Chief Complaint  Patient presents with   Medical Management of Chronic Issues    Patient presents for a medication review.    HPI:  Pt is a 80 y.o. female seen today for an acute visit for medication review as new patient establishment with [facility] Hx of Vit D deficiency, Vit D 17 06/10/21 Vit B12 deficiency, takes Vit B12, no CBC located Memory deficit, resides in [place] [facility] for care needs.  TSH 1.44 06/10/21, MRI brain, showed Generalized volume loss and findings of chronic microvascular ischemia   Past Medical History:  Diagnosis Date   B12 deficiency    Past Surgical History:  Procedure Laterality Date   NO PAST SURGERIES      No Known Allergies  Allergies as of 08/05/2021   No Known Allergies      Medication List        Accurate as of August 05, 2021  3:34 PM. If you have any questions, ask your nurse or doctor.          VITAMIN B-12 IJ Inject as directed. Gets an injection once a month        Review of Systems  Constitutional:  Negative for activity change, appetite change and fatigue.  HENT:  Negative for congestion, trouble swallowing and voice change.   Eyes:  Negative for visual disturbance.  Respiratory:  Negative for cough and shortness of breath.   Cardiovascular:  Negative for leg swelling.  Gastrointestinal:  Negative for abdominal pain and  constipation.  Genitourinary:  Negative for dysuria and frequency.  Musculoskeletal:  Negative for arthralgias and gait problem.  Skin:  Negative for color change.  Neurological:  Negative for speech difficulty, weakness and headaches.  Psychiatric/Behavioral:  Negative for behavioral problems, confusion and sleep disturbance. The patient is not nervous/anxious.    Immunization History  Administered Date(s) Administered   Influenza, High Dose Seasonal PF 09/03/2018   Pertinent  Health Maintenance Due  Topic Date Due   DEXA SCAN  Never done   PNA vac Low Risk Adult (1 of 2 - PCV13) Never done   INFLUENZA VACCINE  07/20/2021   Fall Risk  06/10/2021  Falls in the past year? 0   Functional Status Survey:    Vitals:   08/05/21 1202  BP: 110/60  Pulse: 88  Resp: 18  Temp: 98.2 F (36.8 C)  SpO2: 95%  Weight: 102 lb 8 oz (46.5 kg)  Height: '5\' 2"'$  (1.575 m)   Body mass index is 18.75 kg/m. Physical Exam Vitals and nursing note reviewed.  Constitutional:      Appearance: Normal appearance.  HENT:     Head: Normocephalic and atraumatic.     Nose: Nose normal.     Mouth/Throat:     Mouth: Mucous membranes are moist.  Eyes:     Extraocular Movements: Extraocular movements intact.     Conjunctiva/sclera: Conjunctivae normal.     Pupils:  Pupils are equal, round, and reactive to light.  Cardiovascular:     Rate and Rhythm: Normal rate and regular rhythm.     Heart sounds: No murmur heard. Pulmonary:     Effort: Pulmonary effort is normal.     Breath sounds: Rhonchi present. No wheezing or rales.  Abdominal:     General: Bowel sounds are normal.     Palpations: Abdomen is soft.     Tenderness: There is no abdominal tenderness. There is no guarding or rebound.  Musculoskeletal:     Cervical back: Normal range of motion and neck supple.     Right lower leg: No edema.     Left lower leg: No edema.  Skin:    General: Skin is warm and dry.  Neurological:     General: No  focal deficit present.     Mental Status: She is alert and oriented to person, place, and time. Mental status is at baseline.     Motor: No weakness.     Coordination: Coordination normal.     Gait: Gait normal.  Psychiatric:        Mood and Affect: Mood normal.        Behavior: Behavior normal.        Thought Content: Thought content normal.        Judgment: Judgment normal.    Labs reviewed: Recent Labs    06/10/21 1004  NA 142  K 4.2  CL 101  CO2 23  GLUCOSE 93  BUN 11  CREATININE 0.67  CALCIUM 9.5   Recent Labs    06/10/21 1004  AST 13  ALT 11  ALKPHOS 83  BILITOT 0.8  PROT 7.0  ALBUMIN 5.0*   No results for input(s): WBC, NEUTROABS, HGB, HCT, MCV, PLT in the last 8760 hours. Lab Results  Component Value Date   TSH 1.440 06/10/2021   No results found for: HGBA1C No results found for: CHOL, HDL, LDLCALC, LDLDIRECT, TRIG, CHOLHDL  Significant Diagnostic Results in last 30 days:  No results found.  Assessment/Plan Mild cognitive impairment with memory loss Will obtain MMSE.  resides in [place] [facility] for care needs.  TSH 1.44 06/10/21, MRI brain, showed Generalized volume loss and findings of chronic microvascular ischemia    Family/ staff Communication: plan of care reviewed with the patient and charge nurse.   Labs/tests ordered:  CBC/diff  Time spend 40 minutes.

## 2021-08-05 NOTE — Assessment & Plan Note (Signed)
Will obtain MMSE.  resides in [place] [facility] for care needs.  TSH 1.44 06/10/21, MRI brain, showed Generalized volume loss and findings of chronic microvascular ischemia

## 2021-08-06 DIAGNOSIS — D519 Vitamin B12 deficiency anemia, unspecified: Secondary | ICD-10-CM | POA: Diagnosis not present

## 2021-08-12 DIAGNOSIS — G301 Alzheimer's disease with late onset: Secondary | ICD-10-CM | POA: Diagnosis not present

## 2021-08-12 DIAGNOSIS — F0281 Dementia in other diseases classified elsewhere with behavioral disturbance: Secondary | ICD-10-CM | POA: Diagnosis not present

## 2021-08-12 DIAGNOSIS — R413 Other amnesia: Secondary | ICD-10-CM | POA: Diagnosis not present

## 2021-08-20 DIAGNOSIS — R41841 Cognitive communication deficit: Secondary | ICD-10-CM | POA: Diagnosis not present

## 2021-08-24 DIAGNOSIS — R41841 Cognitive communication deficit: Secondary | ICD-10-CM | POA: Diagnosis not present

## 2021-08-27 DIAGNOSIS — R41841 Cognitive communication deficit: Secondary | ICD-10-CM | POA: Diagnosis not present

## 2021-08-31 DIAGNOSIS — R41841 Cognitive communication deficit: Secondary | ICD-10-CM | POA: Diagnosis not present

## 2021-09-02 DIAGNOSIS — R413 Other amnesia: Secondary | ICD-10-CM | POA: Diagnosis not present

## 2021-09-02 DIAGNOSIS — R41841 Cognitive communication deficit: Secondary | ICD-10-CM | POA: Diagnosis not present

## 2021-09-07 DIAGNOSIS — R41841 Cognitive communication deficit: Secondary | ICD-10-CM | POA: Diagnosis not present

## 2021-09-09 DIAGNOSIS — R413 Other amnesia: Secondary | ICD-10-CM | POA: Diagnosis not present

## 2021-09-10 DIAGNOSIS — R41841 Cognitive communication deficit: Secondary | ICD-10-CM | POA: Diagnosis not present

## 2021-09-15 DIAGNOSIS — R41841 Cognitive communication deficit: Secondary | ICD-10-CM | POA: Diagnosis not present

## 2021-09-16 DIAGNOSIS — R41841 Cognitive communication deficit: Secondary | ICD-10-CM | POA: Diagnosis not present

## 2021-09-24 IMAGING — MR MR HEAD WO/W CM
12 series · 48 of 48 positions shown · IV contrast (9 ml Multihance)
Comparison: None.

CLINICAL DATA: Dementia

EXAM:
MRI HEAD WITHOUT AND WITH CONTRAST
TECHNIQUE: Multiplanar, multiecho pulse sequences of the brain and surrounding
structures were obtained without and with intravenous contrast.
CONTRAST:  10mL MULTIHANCE GADOBENATE DIMEGLUMINE 529 MG/ML IV SOLN

[Series 2: T1 · sagittal · 5.0mm · 0.45mm/px · 1 of 22 slices shown]
[im 1/22]
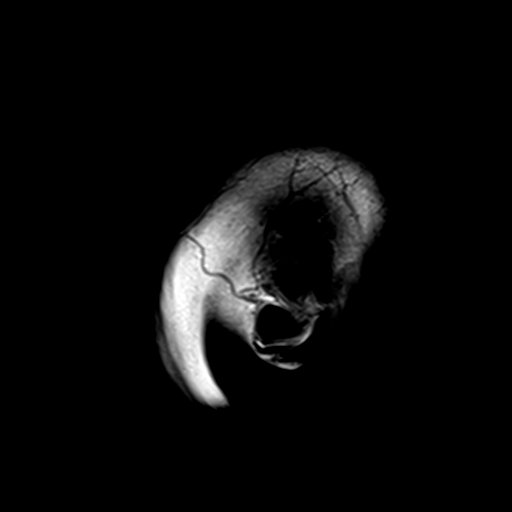

[Series 3: DWI · axial · 3.0mm · 1.80mm/px · z∈[-40,+103]mm · 7 of 100 slices shown (1 of 4)]
[im 1/100]
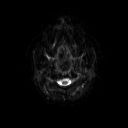
[im 17/100]
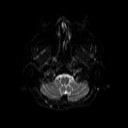
[im 34/100]
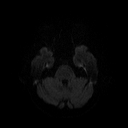
[im 50/100]
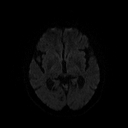
[im 67/100]
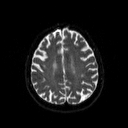
[im 83/100]
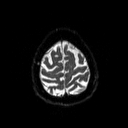
[im 100/100]
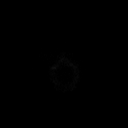

[Series 4: DWI · axial · 3.0mm · 1.80mm/px · z∈[-40,+103]mm · 3 of 49 slices shown (2 of 4)]
[im 1/49]
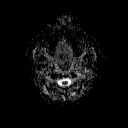
[im 25/49]
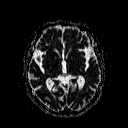
[im 49/49]
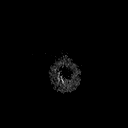

[Series 5: DWI · coronal · 5.0mm · 1.80mm/px · 4 of 56 slices shown (3 of 4)]
[im 1/56]
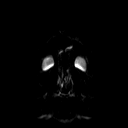
[im 19/56]
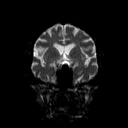
[im 37/56]
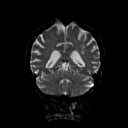
[im 56/56]
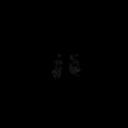

[Series 6: DWI · coronal · 5.0mm · 1.80mm/px · 2 of 29 slices shown (4 of 4)]
[im 1/29]
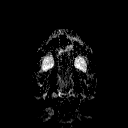
[im 29/29]
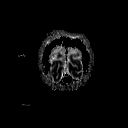

[Series 7: T2 · axial · 5.0mm · 0.60mm/px · z∈[-39,+100]mm · 2 of 22 slices shown (1 of 2)]
[im 1/22]
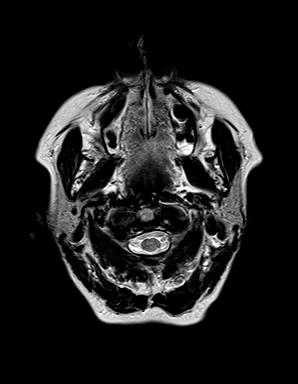
[im 22/22]
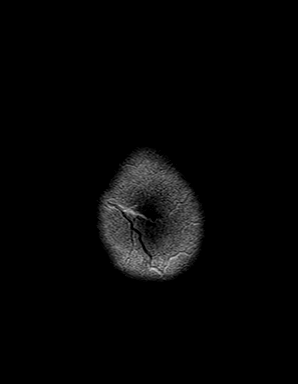

[Series 8: FLAIR · axial · 3.0mm · 0.45mm/px · z∈[-32,+99]mm · 2 of 30 slices shown]
[im 1/30]
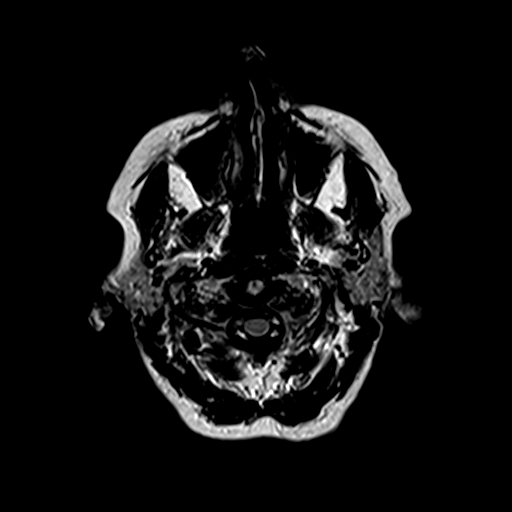
[im 30/30]
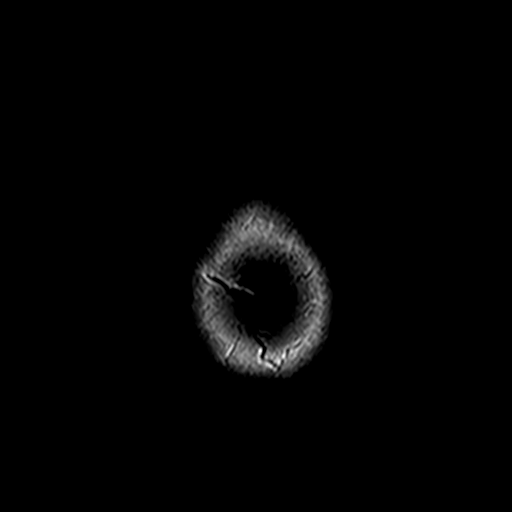

[Series 10: swi_images · axial · 4.0mm · 0.90mm/px · z∈[-35,+102]mm · 3 of 36 slices shown]
[im 1/36]
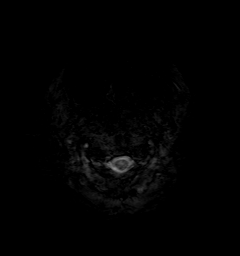
[im 18/36]
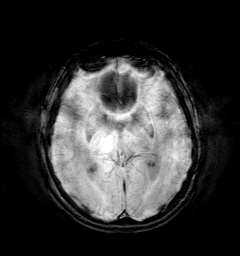
[im 36/36]
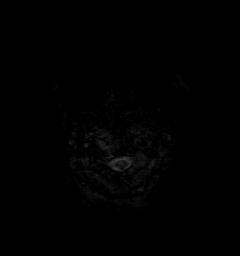

[Series 11: t1_mpr_tra · axial · 1.0mm · 0.75mm/px · z∈[-35,+104]mm · 10 of 144 slices shown]
[im 1/144]
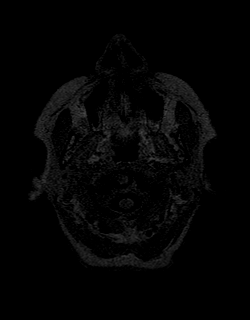
[im 16/144]
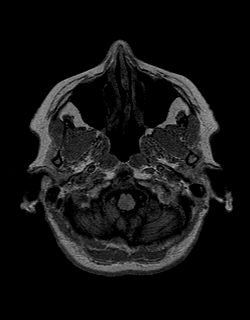
[im 32/144]
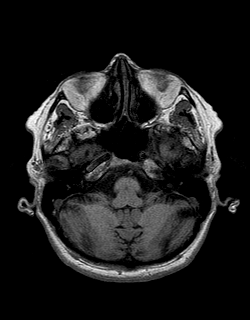
[im 48/144]
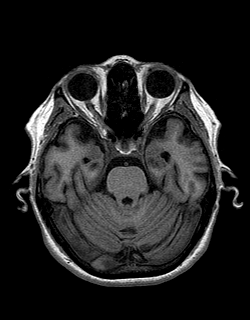
[im 64/144]
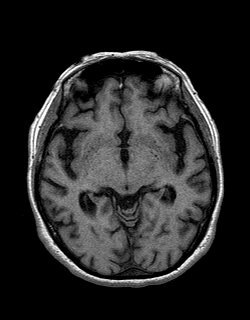
[im 80/144]
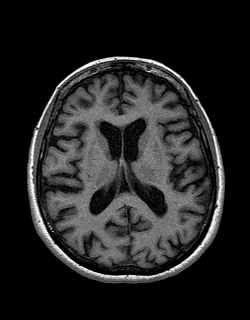
[im 96/144]
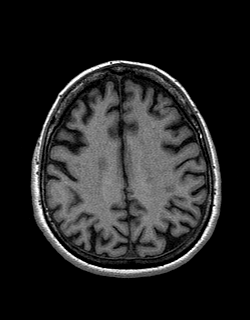
[im 112/144]
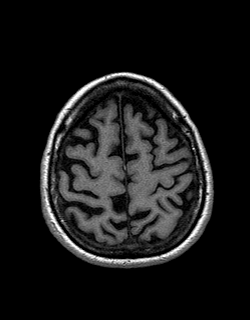
[im 128/144]
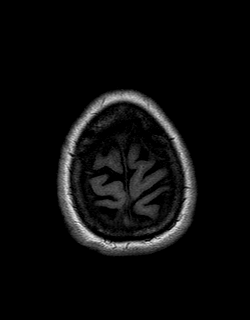
[im 144/144]
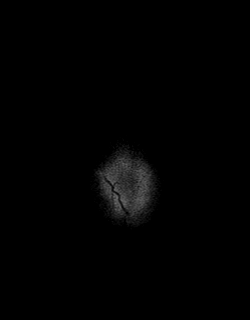

[Series 12: T2 · coronal · 5.0mm · 0.45mm/px · 2 of 23 slices shown (2 of 2)]
[im 1/23]
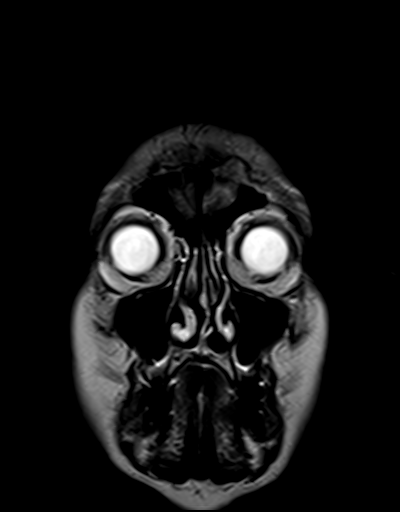
[im 23/23]
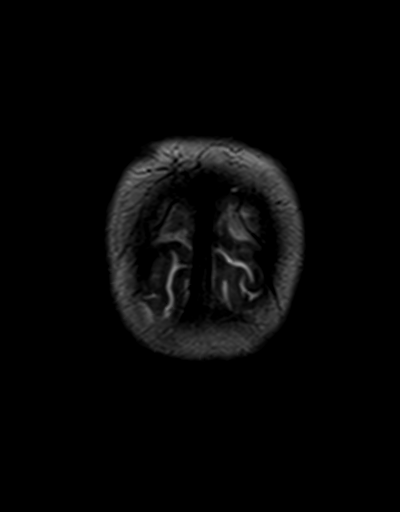

[Series 13: t1_mpr_tra post · axial · 1.0mm · 0.75mm/px · z∈[-35,+104]mm · 10 of 144 slices shown]
[im 1/144]
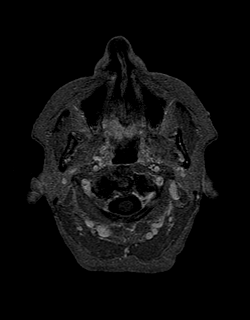
[im 16/144]
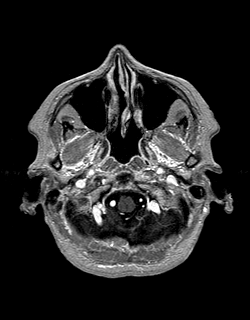
[im 32/144]
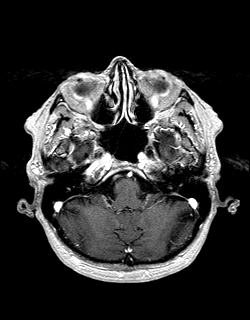
[im 48/144]
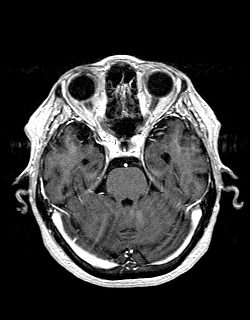
[im 64/144]
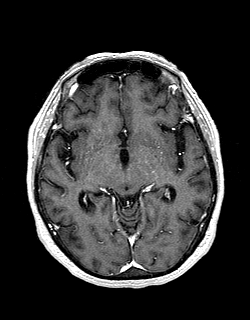
[im 80/144]
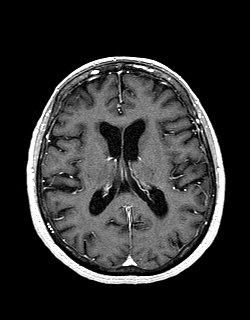
[im 96/144]
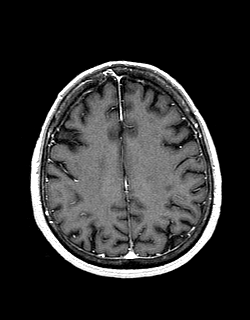
[im 112/144]
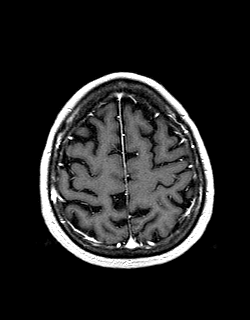
[im 128/144]
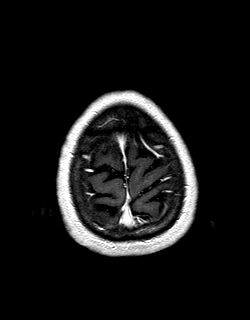
[im 144/144]
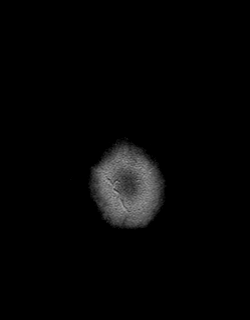

[Series 14: post cor · coronal · 5.0mm · 0.45mm/px · 2 of 23 slices shown]
[im 1/23]
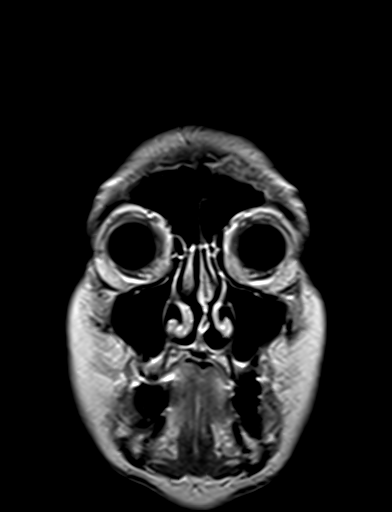
[im 23/23]
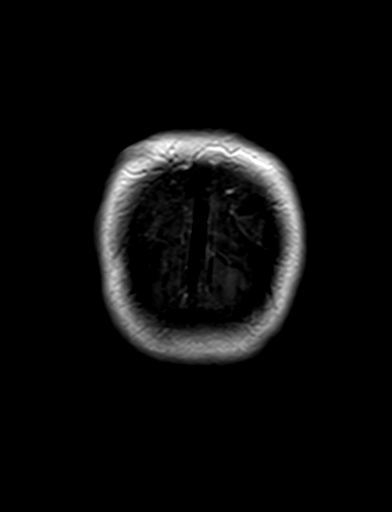

[48 of 48 positions shown; findings below may reference images not displayed]

FINDINGS: Brain: No acute infarct, mass effect or extra-axial collection. No
acute or chronic hemorrhage. There is multifocal hyperintense
T2-weighted signal within the white matter. Generalized volume loss
without a clear lobar predilection. The midline structures are
normal. There is no abnormal contrast enhancement.

Vascular: Major flow voids are preserved.

Skull and upper cervical spine: Normal calvarium and skull base.
Visualized upper cervical spine and soft tissues are normal.

Sinuses/Orbits:No paranasal sinus fluid levels or advanced mucosal
thickening. No mastoid or middle ear effusion. Normal orbits.
IMPRESSION: 1. No acute intracranial abnormality.
2. Generalized volume loss and findings of chronic microvascular
ischemia.

## 2021-09-29 ENCOUNTER — Encounter: Payer: Medicare PPO | Admitting: Counselor

## 2021-09-30 ENCOUNTER — Encounter: Payer: Medicare PPO | Admitting: Counselor

## 2021-10-09 ENCOUNTER — Encounter: Payer: Medicare PPO | Admitting: Counselor

## 2021-10-29 ENCOUNTER — Ambulatory Visit: Payer: Medicare PPO | Admitting: Neurology

## 2021-10-29 ENCOUNTER — Encounter: Payer: Self-pay | Admitting: Neurology

## 2021-10-29 VITALS — BP 119/77 | HR 84 | Ht 62.0 in | Wt 116.0 lb

## 2021-10-29 DIAGNOSIS — F0280 Dementia in other diseases classified elsewhere without behavioral disturbance: Secondary | ICD-10-CM | POA: Diagnosis not present

## 2021-10-29 DIAGNOSIS — G309 Alzheimer's disease, unspecified: Secondary | ICD-10-CM | POA: Diagnosis not present

## 2021-10-29 MED ORDER — DONEPEZIL HCL 5 MG PO TABS: 5.0000 mg | ORAL_TABLET | Freq: Every day | ORAL | 3 refills | Status: DC

## 2021-10-29 NOTE — Patient Instructions (Addendum)
Patient diagnosed with ALzheimer's dementia by formal neurocognitive testing.  -Start Aricept/donepezil 5mg . In 2-4 weeks if no side effects would like to increase to 10mg  a day, mychart me or ask medical personnel or Dr. Harrington Challenger to increase the Ricept. -When stable on Aricept, Next medications is Namenda/memantine and we can start at 5mg  twice daily then increase to 10mg  twice daily if no side effects -Patient wishes to follow with Dr. Harrington Challenger, return to primary care and see Neurology only as needed  Alzheimer's Disease Alzheimer's disease is a brain disease that affects memory, thinking, language, and behavior. People with Alzheimer's disease lose mental abilities, and the disease gets worse over time. Alzheimer's disease is a form of dementia. What are the causes? This condition develops when a protein called beta-amyloid forms deposits in the brain. It is not known what causes these deposits to form. Alzheimer's disease may also be caused by a gene mutation that is inherited from one parent or both parents. A gene mutation is a harmful change in a gene. Not everyone who inherits the genetic mutation will get the disease. What increases the risk? You are more likely to develop this condition if you: Are older than age 109. Are female. Have any of these medical conditions: High blood pressure. Diabetes. Heart or blood vessel disease. Smoke. Have obesity. Have had a brain injury. Have had a stroke. Have a family history of dementia. What are the signs or symptoms? Symptoms of this condition may happen in three stages, which often overlap. Early stage In this stage, you may continue to be independent. You may still be able to drive, work, and be social. Symptoms in this stage include: Minor memory problems, such as forgetting a name, words, or what you did recently. Difficulty with: Paying attention. Communicating. Doing familiar tasks. Problem solving or doing calculations. Following  instructions. Learning new things. Anxiety. Social withdrawal. Loss of motivation. Moderate stage In this stage, you will start to need care. Symptoms in this stage include: Difficulty with expressing thoughts. Memory loss that affects daily life. This can include forgetting: Recent events that have happened. If you have taken medicines or eaten. Familiar places. You may get lost while walking or driving. To pay bills or manage finances. Personal hygiene such as bathing or using the bathroom. Confusion about where you are or what time it is. Difficulty in judging distance. Changes in personality, mood, and behavior. You may be [place], irritable, angry, frustrated, fearful, anxious, or suspicious. Poor reasoning and judgment. Delusions or hallucinations. Changes in sleep patterns. Severe stage In the final stage, you will need help with your personal care and daily activities. Symptoms in this stage include: Worsening memory loss. Personality changes. Loss of awareness of your surroundings. Changes in physical abilities, including the ability to walk, sit, and swallow. Difficulty in communicating. Inability to control your bladder and bowels. Increasing confusion. Increasing behavior changes. How is this diagnosed? This condition is diagnosed by a health care provider who specializes in diseases of the nervous system (neurologist) or one who specializes in care of the elderly (geriatrician or geriatric psychiatrist). Other causes of dementia may also be ruled out. Your health care provider will talk with you and your family, friends, or caregivers about your history and symptoms. A thorough medical history will be taken, and you will have a physical exam and tests. Tests may include: Lab tests, such as blood or urine tests. Imaging tests, such as a CT scan, a PET scan, or an MRI. A lumbar  puncture. This test involves removing and testing a small amount of the fluid that surrounds the  brain and spinal cord. An electroencephalogram (EEG). In this test, small metal discs are used to measure electrical activity in the brain. Memory tests, cognitive tests, and neuropsychological tests. These tests evaluate brain function. Genetic testing. This may be done if you have early onset of the disease (before age 59) or if other family members have the disease. How is this treated? At this time, there is no treatment to cure Alzheimer's disease or stop it from getting worse. The goals of treatment are: To manage behavioral changes. To provide you with a safe environment. To help manage daily life for you and your caregivers. The following treatment options are available: Medicines. Medicines may help the memory work better and manage behavioral symptoms. Cognitive therapy. Cognitive therapy provides you with education, support, and memory aids. It is most helpful in the early stages of the condition. Counseling or spiritual guidance. It is normal to have a lot of feelings, including anger, relief, fear, and isolation. Counseling and guidance can help you deal with these feelings. Caregiving. This involves having caregivers help you with your daily activities. Family support groups. These provide education, emotional support, and information about community resources to family members who are taking care of you. Follow these instructions at home: Medicines Take over-the-counter and prescription medicines only as told by your health care provider. Use a pill organizer or pill reminder to help you manage your medicines. Avoid taking medicines that can affect thinking, such as pain medicines or sleeping medicines. Lifestyle Make healthy lifestyle choices: Be physically active as told by your health care provider. Regular exercise may help improve symptoms. Do not use any products that contain nicotine or tobacco, such as cigarettes, e-cigarettes, and chewing tobacco. If you need help  quitting, ask your health care provider. Do not drink alcohol. Eat a healthy diet. Practice stress-management techniques when you get stressed. Stay social. Drink enough fluid to keep your urine pale yellow. Make sure to get quality sleep. Avoid taking long naps during the day. Take short naps of 30 minutes or less if needed. Keep your sleeping area dark and cool. Avoid exercising during the few hours before you go to bed. Avoid caffeine products in the afternoon and evening. General instructions Work with your health care provider to determine what you need help with and what your safety needs are. If you were given a bracelet that identifies you as a person with memory loss or tracks your location, make sure to wear it at all times. Talk with your health care provider about whether it is safe for you to drive. Work with your family to make important decisions, such as advance directives, medical power of attorney, or a living will. Keep all follow-up visits. This is important. Where to find more information The [employer]: Call the 24-hour helpline at 1-952-721-1766, or visit [employer] Contact a health care provider if: You have nausea, vomiting, or trouble with eating related to a medicine. You have worsening mood or behavior changes, such as depression, anxiety, or hallucinations. You or your family members become concerned for your safety. Get help right away if: You become less responsive or are difficult to wake up. Your memory suddenly gets worse. You feel that you want to harm yourself. If you ever feel like you may hurt yourself or others, or have thoughts about taking your own life, get help right away. Go to your nearest emergency  department or: Call your local emergency services (911 in the [place] Call a suicide crisis helpline, such as the [facility] at 818-416-3982 or 988 in the [place]. This is open 24 hours a day in the [place] Text  the Crisis Text Line at 970-316-4051 (in the [place].). Summary Alzheimer's disease is a brain disease that affects memory, thinking, language, and behavior. Alzheimer's disease is a form of dementia. This condition is diagnosed by a specialist in diseases of the nervous system (neurologist) or one who specializes in care of the elderly. At this time, there is no treatment to cure Alzheimer's disease or stop it from getting worse. The goal of treatment is to help you manage any symptoms. Work with your family to make important decisions, such as advance directives, medical power of attorney, or a living will. This information is not intended to replace advice given to you by your health care provider. Make sure you discuss any questions you have with your health care provider. Document Revised: 07/01/2021 Document Reviewed: 03/24/2020 Elsevier Patient Education  [place]. Memantine extended release capsules What is this medication? MEMANTINE (MEM an teen) is used to treat dementia caused by Alzheimer's disease. This medicine may be used for other purposes; ask your health care provider or pharmacist if you have questions. COMMON BRAND NAME(S): Namenda XR What should I tell my care team before I take this medication? They need to know if you have any of these conditions: difficulty passing urine kidney disease liver disease seizures an unusual or allergic reaction to memantine, other medicines, foods, dyes, or preservatives pregnant or trying to get pregnant breast-feeding How should I use this medication? Take this medicine by mouth with a glass of water. Follow the directions on the prescription label. You may take this medicine with or without food. You may swallow the capsules whole or open them and sprinkle the entire contents on applesauce before swallowing. Other than sprinkling the medicine on applesauce, the capsules should be swallowed whole and not divided, chewed, or crushed. Take  your doses at regular intervals. Do not take your medicine more often than directed. Continue to take your medicine even if you feel better. Do not stop taking except on the advice of your doctor or health care professional. Talk to your pediatrician regarding the use of this medicine in children. Special care may be needed. Overdosage: If you think you have taken too much of this medicine contact a poison control center or emergency room at once. NOTE: This medicine is only for you. Do not share this medicine with others. What if I miss a dose? If you miss a dose, take it as soon as you can. If it is almost time for your next dose, take only that dose. Do not take double or extra doses. If you do not take your medicine for several days, contact your health care provider. Your dose may need to be changed. What may interact with this medication? acetazolamide amantadine cimetidine dextromethorphan dofetilide hydrochlorothiazide ketamine metformin methazolamide quinidine ranitidine sodium bicarbonate triamterene This list may not describe all possible interactions. Give your health care provider a list of all the medicines, herbs, non-prescription drugs, or dietary supplements you use. Also tell them if you smoke, drink alcohol, or use illegal drugs. Some items may interact with your medicine. What should I watch for while using this medication? Visit your doctor or health care professional for regular checks on your progress. Check with your doctor or health care professional  if there is no improvement in your symptoms or if they get worse. You may get drowsy or dizzy. Do not drive, use machinery, or do anything that needs mental alertness until you know how this drug affects you. Do not stand or sit up quickly, especially if you are an older patient. This reduces the risk of dizzy or fainting spells. Alcohol can make you more drowsy and dizzy. Avoid alcoholic drinks. What side effects may I  notice from receiving this medication? Side effects that you should report to your doctor or health care professional as soon as possible: agitation or a feeling of restlessness allergic reactions like skin rash, itching or hives, swelling of the face, lips, or tongue depressed mood dizziness hallucinations redness, blistering, peeling or loosening of the skin, including inside the mouth seizures vomiting Side effects that usually do not require medical attention (report to your doctor or health care professional if they continue or are bothersome): constipation diarrhea headache nausea trouble sleeping This list may not describe all possible side effects. Call your doctor for medical advice about side effects. You may report side effects to FDA at 1-800-FDA-1088. Where should I keep my medication? Keep out of the reach of children. Store at room temperature between 15 degrees and 30 degrees C (59 degrees and 86 degrees F). Throw away any unused medicine after the expiration date. NOTE: This sheet is a summary. It may not cover all possible information. If you have questions about this medicine, talk to your doctor, pharmacist, or health care provider.  2022 Elsevier/Gold Standard (2016-01-08 00:00:00) Donepezil Tablets What is this medication? DONEPEZIL (doe NEP e zil) treats memory loss and confusion (dementia) in people who have Alzheimer disease. It works by improving attention, memory, and the ability to engage in daily activities. It is not a cure for dementia or Alzheimer disease. This medicine may be used for other purposes; ask your health care provider or pharmacist if you have questions. COMMON BRAND NAME(S): Aricept What should I tell my care team before I take this medication? They need to know if you have any of these conditions: Asthma or other lung disease Difficulty passing urine Head injury Heart disease History of irregular heartbeat Liver disease Seizures  (convulsions) Stomach or intestinal disease, ulcers or stomach bleeding An unusual or allergic reaction to donepezil, other medications, foods, dyes, or preservatives Pregnant or trying to get pregnant Breast-feeding How should I use this medication? Take this medication by mouth with a glass of water. Follow the directions on the prescription label. You may take this medication with or without food. Take this medication at regular intervals. This medication is usually taken before bedtime. Do not take it more often than directed. Continue to take your medication even if you feel better. Do not stop taking except on your care team's advice. If you are taking the 23 mg donepezil tablet, swallow it whole; do not cut, crush, or chew it. Talk to your care team about the use of this medication in children. Special care may be needed. Overdosage: If you think you have taken too much of this medicine contact a poison control center or emergency room at once. NOTE: This medicine is only for you. Do not share this medicine with others. What if I miss a dose? If you miss a dose, take it as soon as you can. If it is almost time for your next dose, take only that dose, do not take double or extra doses. What may interact with  this medication? Do not take this medication with any of the following: Certain medications for fungal infections like itraconazole, fluconazole, posaconazole, and voriconazole Cisapride Dextromethorphan; quinidine Dronedarone Pimozide Quinidine Thioridazine This medication may also interact with the following: Antihistamines for allergy, cough and cold Atropine Bethanechol Carbamazepine Certain medications for bladder problems like oxybutynin, tolterodine Certain medications for Parkinson's disease like benztropine, trihexyphenidyl Certain medications for stomach problems like dicyclomine, hyoscyamine Certain medications for travel sickness like  scopolamine Dexamethasone Dofetilide Ipratropium NSAIDs, medications for pain and inflammation, like ibuprofen or naproxen Other medications for Alzheimer's disease Other medications that prolong the QT interval (cause an abnormal heart rhythm) Phenobarbital Phenytoin Rifampin, rifabutin or rifapentine Ziprasidone This list may not describe all possible interactions. Give your health care provider a list of all the medicines, herbs, non-prescription drugs, or dietary supplements you use. Also tell them if you smoke, drink alcohol, or use illegal drugs. Some items may interact with your medicine. What should I watch for while using this medication? Visit your care team for regular checks on your progress. Check with your care team if your symptoms do not get better or if they get worse. You may get drowsy or dizzy. Do not drive, use machinery, or do anything that needs mental alertness until you know how this medication affects you. What side effects may I notice from receiving this medication? Side effects that you should report to your care team as soon as possible: Allergic reactions--skin rash, itching, hives, swelling of the face, lips, tongue, or throat Peptic ulcer--burning stomach pain, loss of appetite, bloating, burping, heartburn, nausea, vomiting Seizures Slow heartbeat--dizziness, feeling faint or lightheaded, confusion, trouble breathing, unusual weakness or fatigue Stomach bleeding--bloody or black, tar-like stools, vomiting blood or brown material that looks like coffee grounds Trouble passing urine Side effects that usually do not require medical attention (report these to your care team if they continue or are bothersome): Diarrhea Fatigue Loss of appetite Muscle pain or cramps Nausea Trouble sleeping This list may not describe all possible side effects. Call your doctor for medical advice about side effects. You may report side effects to FDA at 1-800-FDA-1088. Where  should I keep my medication? Keep out of reach of children. Store at room temperature between 15 and 30 degrees C (59 and 86 degrees F). Throw away any unused medication after the expiration date. NOTE: This sheet is a summary. It may not cover all possible information. If you have questions about this medicine, talk to your doctor, pharmacist, or health care provider.  2022 Elsevier/Gold Standard (2021-07-22 00:00:00)   Memory Compensation Strategies  Use "WARM" strategy.  W= write it down  A= associate it  R= repeat it  M= make a mental note  2.   You can keep a Social worker.  Use a 3-ring notebook with sections for the following: calendar, important names and phone numbers,  medications, doctors' names/phone numbers, lists/reminders, and a section to journal what you did  each day.   3.    Use a calendar to write appointments down.  4.    Write yourself a schedule for the day.  This can be placed on the calendar or in a separate section of the Memory Notebook.  Keeping a  regular schedule can help memory.  5.    Use medication organizer with sections for each day or morning/evening pills.  You may need help loading it  6.    Keep a basket, or pegboard by the door.  Place items that  you need to take out with you in the basket or on the pegboard.  You may also want to  include a message board for reminders.  7.    Use sticky notes.  Place sticky notes with reminders in a place where the task is performed.  For example: " turn off the  stove" placed by the stove, "lock the door" placed on the door at eye level, " take your medications" on  the bathroom mirror or by the place where you normally take your medications.  8.    Use alarms/timers.  Use while cooking to remind yourself to check on food or as a reminder to take your medicine, or as a  reminder to make a call, or as a reminder to perform another task, etc.''  Donepezil Tablets What is this medication? DONEPEZIL (doe  NEP e zil) treats memory loss and confusion (dementia) in people who have Alzheimer disease. It works by improving attention, memory, and the ability to engage in daily activities. It is not a cure for dementia or Alzheimer disease. This medicine may be used for other purposes; ask your health care provider or pharmacist if you have questions. COMMON BRAND NAME(S): Aricept What should I tell my care team before I take this medication? They need to know if you have any of these conditions: Asthma or other lung disease Difficulty passing urine Head injury Heart disease History of irregular heartbeat Liver disease Seizures (convulsions) Stomach or intestinal disease, ulcers or stomach bleeding An unusual or allergic reaction to donepezil, other medications, foods, dyes, or preservatives Pregnant or trying to get pregnant Breast-feeding How should I use this medication? Take this medication by mouth with a glass of water. Follow the directions on the prescription label. You may take this medication with or without food. Take this medication at regular intervals. This medication is usually taken before bedtime. Do not take it more often than directed. Continue to take your medication even if you feel better. Do not stop taking except on your care team's advice. If you are taking the 23 mg donepezil tablet, swallow it whole; do not cut, crush, or chew it. Talk to your care team about the use of this medication in children. Special care may be needed. Overdosage: If you think you have taken too much of this medicine contact a poison control center or emergency room at once. NOTE: This medicine is only for you. Do not share this medicine with others. What if I miss a dose? If you miss a dose, take it as soon as you can. If it is almost time for your next dose, take only that dose, do not take double or extra doses. What may interact with this medication? Do not take this medication with any of the  following: Certain medications for fungal infections like itraconazole, fluconazole, posaconazole, and voriconazole Cisapride Dextromethorphan; quinidine Dronedarone Pimozide Quinidine Thioridazine This medication may also interact with the following: Antihistamines for allergy, cough and cold Atropine Bethanechol Carbamazepine Certain medications for bladder problems like oxybutynin, tolterodine Certain medications for Parkinson's disease like benztropine, trihexyphenidyl Certain medications for stomach problems like dicyclomine, hyoscyamine Certain medications for travel sickness like scopolamine Dexamethasone Dofetilide Ipratropium NSAIDs, medications for pain and inflammation, like ibuprofen or naproxen Other medications for Alzheimer's disease Other medications that prolong the QT interval (cause an abnormal heart rhythm) Phenobarbital Phenytoin Rifampin, rifabutin or rifapentine Ziprasidone This list may not describe all possible interactions. Give your health care provider a list of all the  medicines, herbs, non-prescription drugs, or dietary supplements you use. Also tell them if you smoke, drink alcohol, or use illegal drugs. Some items may interact with your medicine. What should I watch for while using this medication? Visit your care team for regular checks on your progress. Check with your care team if your symptoms do not get better or if they get worse. You may get drowsy or dizzy. Do not drive, use machinery, or do anything that needs mental alertness until you know how this medication affects you. What side effects may I notice from receiving this medication? Side effects that you should report to your care team as soon as possible: Allergic reactions--skin rash, itching, hives, swelling of the face, lips, tongue, or throat Peptic ulcer--burning stomach pain, loss of appetite, bloating, burping, heartburn, nausea, vomiting Seizures Slow heartbeat--dizziness,  feeling faint or lightheaded, confusion, trouble breathing, unusual weakness or fatigue Stomach bleeding--bloody or black, tar-like stools, vomiting blood or brown material that looks like coffee grounds Trouble passing urine Side effects that usually do not require medical attention (report these to your care team if they continue or are bothersome): Diarrhea Fatigue Loss of appetite Muscle pain or cramps Nausea Trouble sleeping This list may not describe all possible side effects. Call your doctor for medical advice about side effects. You may report side effects to FDA at 1-800-FDA-1088. Where should I keep my medication? Keep out of reach of children. Store at room temperature between 15 and 30 degrees C (59 and 86 degrees F). Throw away any unused medication after the expiration date. NOTE: This sheet is a summary. It may not cover all possible information. If you have questions about this medicine, talk to your doctor, pharmacist, or health care provider.  2022 Elsevier/Gold Standard (2021-07-22 00:00:00) Memantine extended release capsules What is this medication? MEMANTINE (MEM an teen) is used to treat dementia caused by Alzheimer's disease. This medicine may be used for other purposes; ask your health care provider or pharmacist if you have questions. COMMON BRAND NAME(S): Namenda XR What should I tell my care team before I take this medication? They need to know if you have any of these conditions: difficulty passing urine kidney disease liver disease seizures an unusual or allergic reaction to memantine, other medicines, foods, dyes, or preservatives pregnant or trying to get pregnant breast-feeding How should I use this medication? Take this medicine by mouth with a glass of water. Follow the directions on the prescription label. You may take this medicine with or without food. You may swallow the capsules whole or open them and sprinkle the entire contents on applesauce  before swallowing. Other than sprinkling the medicine on applesauce, the capsules should be swallowed whole and not divided, chewed, or crushed. Take your doses at regular intervals. Do not take your medicine more often than directed. Continue to take your medicine even if you feel better. Do not stop taking except on the advice of your doctor or health care professional. Talk to your pediatrician regarding the use of this medicine in children. Special care may be needed. Overdosage: If you think you have taken too much of this medicine contact a poison control center or emergency room at once. NOTE: This medicine is only for you. Do not share this medicine with others. What if I miss a dose? If you miss a dose, take it as soon as you can. If it is almost time for your next dose, take only that dose. Do not take double or extra  doses. If you do not take your medicine for several days, contact your health care provider. Your dose may need to be changed. What may interact with this medication? acetazolamide amantadine cimetidine dextromethorphan dofetilide hydrochlorothiazide ketamine metformin methazolamide quinidine ranitidine sodium bicarbonate triamterene This list may not describe all possible interactions. Give your health care provider a list of all the medicines, herbs, non-prescription drugs, or dietary supplements you use. Also tell them if you smoke, drink alcohol, or use illegal drugs. Some items may interact with your medicine. What should I watch for while using this medication? Visit your doctor or health care professional for regular checks on your progress. Check with your doctor or health care professional if there is no improvement in your symptoms or if they get worse. You may get drowsy or dizzy. Do not drive, use machinery, or do anything that needs mental alertness until you know how this drug affects you. Do not stand or sit up quickly, especially if you are an older  patient. This reduces the risk of dizzy or fainting spells. Alcohol can make you more drowsy and dizzy. Avoid alcoholic drinks. What side effects may I notice from receiving this medication? Side effects that you should report to your doctor or health care professional as soon as possible: agitation or a feeling of restlessness allergic reactions like skin rash, itching or hives, swelling of the face, lips, or tongue depressed mood dizziness hallucinations redness, blistering, peeling or loosening of the skin, including inside the mouth seizures vomiting Side effects that usually do not require medical attention (report to your doctor or health care professional if they continue or are bothersome): constipation diarrhea headache nausea trouble sleeping This list may not describe all possible side effects. Call your doctor for medical advice about side effects. You may report side effects to FDA at 1-800-FDA-1088. Where should I keep my medication? Keep out of the reach of children. Store at room temperature between 15 degrees and 30 degrees C (59 degrees and 86 degrees F). Throw away any unused medicine after the expiration date. NOTE: This sheet is a summary. It may not cover all possible information. If you have questions about this medicine, talk to your doctor, pharmacist, or health care provider.  2022 Elsevier/Gold Standard (2016-01-08 00:00:00)

## 2021-10-29 NOTE — Progress Notes (Signed)
[name]    Provider:  Dr Jaynee Eagles Requesting Provider: Lawerance Cruel, MD Primary Care Provider:  Lawerance Cruel, MD  CC:  memory loss  11/02/2021: Patient diagnosed with ALzheimer's dementia by formal neurocognitive testing. I discussed the formal memory testing and diagnosis. Patient did not seem to grasp the diagnosis, tried to explain Alzheimer's disease as a progressive neurodegenerative disorder, patient difficulty grasping concepts, asks same questions multiple times, perseverates on how she is doing very well and can do everything on her own. I was able to speak with her companion and he is aware f the diagnosis, did discuss findings with neuropsychologist at [facility], they both did agree to start Aricept and then Namenda, I provided much information on local and national resources, discussed POA and HCPOA, patient is at [facility] now which is great.  -Start Aricept/donepezil 5mg . In 2-4 weeks if no side effects would like to increase to 10mg  a day, mychart me or ask medical personnel or Dr. Harrington Challenger to increase the Ricept. -When stable on Aricept, Next medications is Namenda/memantine and we can start at 5mg  twice daily then increase to 10mg  twice daily if no side effects -Patient wishes to follow with Dr. Harrington Challenger, return to primary care and see Neurology only as needed  MRI brain: personally reviewed images and agree: 06/17/2021 IMPRESSION: 1. No acute intracranial abnormality. 2. Generalized volume loss and findings of chronic microvascular ischemia.  From my read, appears to be moderate to severe volume loss more so in the temporo-parietal areas.   HPI 05/2021:  Shannon Whitaker is a 80 y.o. female here as requested by Lawerance Cruel, MD for memory loss. PMHx osteopenia, high cholesterol, vitamin B12 deficiency, pernicious anemia, memory changes, sleep disturbance, osteoarthritis, allergies and bacterial sinusitis.  I reviewed Dr. Alan Ripper notes, she is on  B12 IM monthly injections.  There are concerns from her neighbor who lives next door to her, neighbor talked at great length about this patient with dementia, the neighbor seems distressed about her care, neighbor discussed his concerns with [name] and reported that patient is very forgetful, the neighbor also mentioned that patient has no family and that he can help her but is very concerned about her and feels she needs more extensive help, neighbor mentioned several times to her healthcare POA that she needs to go to the doctor and she is also not getting the help she needs, neighbor states she is driving herself to the grocery store and she does not go very far but sometimes gets to a stop sign and has trouble remembering where to go, neighbor discussed issue with Dr. Harrington Challenger verbally.  Dr. Alan Ripper office called patient and patient came in for appointment with her neighbor who is been her neighbor for 20 years, patient stated she was feeling nervous inside about leaving the house, wonders where she is going when driving, discussed taxis, reportedly repeatedly asking about varieties and, does not remember what food she eats, forgetting where stores are sometimes.  She is a former smoker quit 20 years ago, Dr. Harrington Challenger collected blood for CBC with differential, B12, CBC was normal (reviewed labs), however vitamin B12 was 159 (which is extremely decreased).  Patient is here with cousin with POA, reported she plans on moving into [place] soon, feeling isolated and loneliness are at the root of her issues, and moving could greatly improve that, mention senior centers but she did not express interest there, Dr. Harrington Challenger helped her with some paperwork, she reported this  to Dr. Harrington Challenger as well on appointment in May 2022. She says she is tired of people being in her business. She doesn't know the date or the day of the month, she taught school for 45 years, she says she will still read the paper and she knows where to look for the date and  day. [facility] is [facility] to [facility]. Their Aunt had dementia and lived with her and her cousin. Grandmother may have had dementia but unclear. Her cousin says she doesn't remember doctor's appointments, but more short-term memory, appointments or names or what she did recently no accidents in the home or accidents in the car, she does not know [place] like she used to, no problems driving within a few miles of home and that is all she needs. She is not missing any bills, not taking medications. She repeats the same questions but patient says she does that for emphasis and not that she forgot. She has a desk calendar and keeps all her information. She puts post-it notes everywhere.   Reviewed notes, labs and imaging from outside physicians, which showed see above  Review of Systems: Patient complains of symptoms per HPI as well as the following symptoms short-term memory loss. Pertinent negatives and positives per HPI. All others negative.   Social History   Socioeconomic History   Marital status: Single    Spouse name: Not on file   Number of children: Not on file   Years of education: Not on file   Highest education level: Not on file  Occupational History   Not on file  Tobacco Use   Smoking status: Former   Smokeless tobacco: Never  Vaping Use   Vaping Use: Never used  Substance and Sexual Activity   Alcohol use: Yes    Comment: rarely a little wine   Drug use: Never   Sexual activity: Not on file  Other Topics Concern   Not on file  Social History Narrative   Lives at [facility] in assisted living    Social Determinants of Health   Financial Resource Strain: Not on file  Food Insecurity: Not on file  Transportation Needs: Not on file  Physical Activity: Not on file  Stress: Not on file  Social Connections: Not on file  Intimate Partner Violence: Not on file    Family History  Problem Relation Age of Onset   Dementia Maternal Grandmother    Dementia Other      Past Medical History:  Diagnosis Date   B12 deficiency     Patient Active Problem List   Diagnosis Date Noted   Alzheimer disease ([place]) 10/29/2021   Pain in left knee 12/23/2017    Past Surgical History:  Procedure Laterality Date   NO PAST SURGERIES      Current Outpatient Medications  Medication Sig Dispense Refill   Cholecalciferol 50 MCG (2000 UT) TABS Take 2,000 Units by mouth daily.     Cyanocobalamin (VITAMIN B-12 IJ) Inject as directed. Gets an injection once a month     donepezil (ARICEPT) 5 MG tablet Take 1 tablet (5 mg total) by mouth at bedtime. If patient has no side effects can increase to 10mg  at bedtime in 2-4 weeks per personnel at [facility] or her primary care Dr. Harrington Challenger. 90 tablet 3   No current facility-administered medications for this visit.    Allergies as of 10/29/2021   (No Known Allergies)    Vitals: BP 119/77 (BP Location: Left Arm, Patient Position:  Sitting)   Pulse 84   Ht 5\' 2"  (1.575 m)   Wt 116 lb (52.6 kg)   BMI 21.22 kg/m  Last Weight:  Wt Readings from Last 1 Encounters:  10/29/21 116 lb (52.6 kg)   Last Height:   Ht Readings from Last 1 Encounters:  10/29/21 5\' 2"  (1.575 m)     Physical exam: Exam: Gen: NAD, conversant                CV: No peripheral edema, warm, nontender Eyes: Conjunctivae clear without exudates or hemorrhage  Neuro: Detailed Neurologic Exam  Speech:    Speech is normal; fluent and spontaneous with normal comprehension.  Cognition: MMSE - Mini Mental State Exam 06/10/2021  Orientation to time 2  Orientation to Place 4  Registration 3  Attention/ Calculation 3  Recall 0  Language- name 2 objects 2  Language- repeat 1  Language- follow 3 step command 2  Language- read & follow direction 1  Write a sentence 1  Copy design 0  Total score 19    Cranial Nerves:      The pupils are equal, round, and reactive to light. Tpupils too small to visiualize fundi. Visual fields are full.  Extraocular movements are intact.the muscles of mastication appear  normal. The face is symmetric. Hearing intact to voice. Voice is normal.    Coordination:    Normal   Gait:    normal.   Motor Observation:    No asymmetry, no atrophy, and no involuntary movements noted. Tone:    Normal muscle tone.    Posture:    Posture is normal. normal erect    Strength:    Moving all extremities equally, no focal deficit notes     Sensation: intact to LT        Assessment/Plan:  80 y.o. female here as requested by Lawerance Cruel, MD for memory loss. Diagnosed with Alzheiemer's dementia via formal memory testing [facility] (Faxed copy to Dr. Harrington Challenger) PMHx osteopenia, high cholesterol, vitamin B12 deficiency, pernicious anemia, memory changes, sleep disturbance, osteoarthritis, allergies and bacterial sinusitis.  I reviewed Dr. Alan Ripper notes, she is on B12 IM monthly injections.   - She is irritable, also very defensive and suspicious side of why we would like this testing. Her MMSE is 19/30.  She is now at [facility]. Her cousin states he is very careful not to upset her because she is very defensive. She appears to have very poor insight into her memory changes and does not seem to understand when we discuss Alzheiemer's disease today, and she still repeats questions today and has difficulty understanding many aspects of our conversation. Again, slightly difficult appointment and extended.  - Started Aricept. She does not wish to come back here, she wishes to follow with Dr. Harrington Challenger. I explained he can continue to titrate the Aricept and Namenda.  - -Start Aricept/donepezil 5mg . In 2-4 weeks if no side effects would like to increase to 10mg  a day, mychart me or ask medical personnel or Dr. Harrington Challenger to increase the Ricept. -When stable on Aricept, Next medications is Namenda/memantine and we can start at 5mg  twice daily then increase to 10mg  twice daily if no side effects -Patient wishes to follow with  Dr. Harrington Challenger, return to primary care and see Neurology only as needed - Faxed report from [facility] to Dr. Harrington Challenger and also provided it in the [facility] packet back to them with my recommendations and prescription for Aricept  - continue  B12 injections for life.   Meds ordered this encounter  Medications   DISCONTD: donepezil (ARICEPT) 5 MG tablet    Sig: Take 1 tablet (5 mg total) by mouth at bedtime.    Dispense:  90 tablet    Refill:  3   donepezil (ARICEPT) 5 MG tablet    Sig: Take 1 tablet (5 mg total) by mouth at bedtime. If patient has no side effects can increase to 10mg  at bedtime in 2-4 weeks per personnel at [facility] or her primary care Dr. Harrington Challenger.    Dispense:  90 tablet    Refill:  3       Cc: Lawerance Cruel, MD,  Lawerance Cruel, MD  Sarina Ill, MD  [facility] [address] [place], [place] [address]  Phone [phone] Fax [phone]  I spent over 60 minutes of face-to-face and non-face-to-face time with patient on the  1. Alzheimer disease ([place])     diagnosis.  This included previsit chart review, lab review, study review, order entry, electronic health record documentation, patient education on the different diagnostic and therapeutic options, counseling and coordination of care, risks and benefits of management, compliance, or risk factor reduction

## 2021-12-09 DIAGNOSIS — J019 Acute sinusitis, unspecified: Secondary | ICD-10-CM | POA: Diagnosis not present

## 2021-12-09 DIAGNOSIS — R059 Cough, unspecified: Secondary | ICD-10-CM | POA: Diagnosis not present

## 2021-12-16 ENCOUNTER — Ambulatory Visit: Payer: Medicare PPO | Admitting: Neurology

## 2022-02-02 DIAGNOSIS — H6122 Impacted cerumen, left ear: Secondary | ICD-10-CM | POA: Diagnosis not present

## 2022-02-02 DIAGNOSIS — Z0000 Encounter for general adult medical examination without abnormal findings: Secondary | ICD-10-CM | POA: Diagnosis not present

## 2022-02-02 DIAGNOSIS — Z131 Encounter for screening for diabetes mellitus: Secondary | ICD-10-CM | POA: Diagnosis not present

## 2022-02-02 DIAGNOSIS — E7800 Pure hypercholesterolemia, unspecified: Secondary | ICD-10-CM | POA: Diagnosis not present

## 2022-02-02 DIAGNOSIS — E559 Vitamin D deficiency, unspecified: Secondary | ICD-10-CM | POA: Diagnosis not present

## 2022-02-02 DIAGNOSIS — D510 Vitamin B12 deficiency anemia due to intrinsic factor deficiency: Secondary | ICD-10-CM | POA: Diagnosis not present

## 2022-02-02 DIAGNOSIS — E538 Deficiency of other specified B group vitamins: Secondary | ICD-10-CM | POA: Diagnosis not present

## 2022-03-09 DIAGNOSIS — M2041 Other hammer toe(s) (acquired), right foot: Secondary | ICD-10-CM | POA: Diagnosis not present

## 2022-05-27 ENCOUNTER — Other Ambulatory Visit: Payer: Self-pay | Admitting: Family Medicine

## 2022-05-27 DIAGNOSIS — Z1231 Encounter for screening mammogram for malignant neoplasm of breast: Secondary | ICD-10-CM

## 2022-08-03 ENCOUNTER — Ambulatory Visit
Admission: RE | Admit: 2022-08-03 | Discharge: 2022-08-03 | Disposition: A | Discharge status: Home / Self Care | Payer: Medicare PPO | Source: Ambulatory Visit | Attending: Family Medicine | Admitting: Family Medicine

## 2022-08-03 DIAGNOSIS — Z1231 Encounter for screening mammogram for malignant neoplasm of breast: Secondary | ICD-10-CM | POA: Diagnosis not present

## 2022-10-18 DIAGNOSIS — F4322 Adjustment disorder with anxiety: Secondary | ICD-10-CM | POA: Diagnosis not present

## 2022-11-08 DIAGNOSIS — F4322 Adjustment disorder with anxiety: Secondary | ICD-10-CM | POA: Diagnosis not present

## 2022-11-16 DIAGNOSIS — F4322 Adjustment disorder with anxiety: Secondary | ICD-10-CM | POA: Diagnosis not present

## 2023-02-08 DIAGNOSIS — F4322 Adjustment disorder with anxiety: Secondary | ICD-10-CM | POA: Diagnosis not present

## 2023-03-09 DIAGNOSIS — Z0000 Encounter for general adult medical examination without abnormal findings: Secondary | ICD-10-CM | POA: Diagnosis not present

## 2023-03-09 DIAGNOSIS — Z6823 Body mass index (BMI) 23.0-23.9, adult: Secondary | ICD-10-CM | POA: Diagnosis not present

## 2023-03-09 DIAGNOSIS — E7800 Pure hypercholesterolemia, unspecified: Secondary | ICD-10-CM | POA: Diagnosis not present

## 2023-03-09 DIAGNOSIS — Z131 Encounter for screening for diabetes mellitus: Secondary | ICD-10-CM | POA: Diagnosis not present

## 2023-03-09 DIAGNOSIS — E538 Deficiency of other specified B group vitamins: Secondary | ICD-10-CM | POA: Diagnosis not present

## 2023-03-09 DIAGNOSIS — E559 Vitamin D deficiency, unspecified: Secondary | ICD-10-CM | POA: Diagnosis not present

## 2023-03-09 DIAGNOSIS — D510 Vitamin B12 deficiency anemia due to intrinsic factor deficiency: Secondary | ICD-10-CM | POA: Diagnosis not present

## 2023-03-17 DIAGNOSIS — R7301 Impaired fasting glucose: Secondary | ICD-10-CM | POA: Diagnosis not present

## 2023-03-17 DIAGNOSIS — F0280 Dementia in other diseases classified elsewhere without behavioral disturbance: Secondary | ICD-10-CM | POA: Diagnosis not present

## 2023-03-17 DIAGNOSIS — G309 Alzheimer's disease, unspecified: Secondary | ICD-10-CM | POA: Diagnosis not present

## 2023-03-17 DIAGNOSIS — Z6823 Body mass index (BMI) 23.0-23.9, adult: Secondary | ICD-10-CM | POA: Diagnosis not present

## 2023-03-17 DIAGNOSIS — E538 Deficiency of other specified B group vitamins: Secondary | ICD-10-CM | POA: Diagnosis not present

## 2023-03-17 DIAGNOSIS — Z0000 Encounter for general adult medical examination without abnormal findings: Secondary | ICD-10-CM | POA: Diagnosis not present

## 2023-03-17 DIAGNOSIS — E559 Vitamin D deficiency, unspecified: Secondary | ICD-10-CM | POA: Diagnosis not present

## 2023-03-17 DIAGNOSIS — E7800 Pure hypercholesterolemia, unspecified: Secondary | ICD-10-CM | POA: Diagnosis not present

## 2023-04-15 DIAGNOSIS — F4322 Adjustment disorder with anxiety: Secondary | ICD-10-CM | POA: Diagnosis not present

## 2023-08-12 DIAGNOSIS — F4322 Adjustment disorder with anxiety: Secondary | ICD-10-CM | POA: Diagnosis not present

## 2023-11-11 DIAGNOSIS — F4322 Adjustment disorder with anxiety: Secondary | ICD-10-CM | POA: Diagnosis not present

## 2024-01-27 DIAGNOSIS — F4322 Adjustment disorder with anxiety: Secondary | ICD-10-CM | POA: Diagnosis not present

## 2024-03-30 DIAGNOSIS — F4322 Adjustment disorder with anxiety: Secondary | ICD-10-CM | POA: Diagnosis not present

## 2024-05-30 DIAGNOSIS — F4322 Adjustment disorder with anxiety: Secondary | ICD-10-CM | POA: Diagnosis not present

## 2024-07-10 DIAGNOSIS — F4322 Adjustment disorder with anxiety: Secondary | ICD-10-CM | POA: Diagnosis not present

## 2024-08-07 DIAGNOSIS — F4322 Adjustment disorder with anxiety: Secondary | ICD-10-CM | POA: Diagnosis not present

## 2024-09-06 ENCOUNTER — Non-Acute Institutional Stay: Payer: Self-pay | Admitting: Nurse Practitioner

## 2024-09-06 ENCOUNTER — Encounter: Payer: Self-pay | Admitting: Nurse Practitioner

## 2024-09-06 DIAGNOSIS — Z66 Do not resuscitate: Secondary | ICD-10-CM

## 2024-09-06 DIAGNOSIS — F0280 Dementia in other diseases classified elsewhere without behavioral disturbance: Secondary | ICD-10-CM

## 2024-09-06 DIAGNOSIS — E559 Vitamin D deficiency, unspecified: Secondary | ICD-10-CM | POA: Diagnosis not present

## 2024-09-06 DIAGNOSIS — F419 Anxiety disorder, unspecified: Secondary | ICD-10-CM | POA: Diagnosis not present

## 2024-09-06 DIAGNOSIS — G309 Alzheimer's disease, unspecified: Secondary | ICD-10-CM

## 2024-09-06 NOTE — Assessment & Plan Note (Signed)
Repeat Vit D level.  

## 2024-09-06 NOTE — Progress Notes (Signed)
 Location:  [facility] Room Number: AL 916 Place of Service:  ALF (13) Provider:  ManXie Danell Vazquez, N.P.   Patient Care Team: Okey Carlin Redbird, MD as PCP - General [facility] Medicine)  Extended Emergency Contact Information Primary Emergency Contact: Lanell Ingle Mobile Phone: [phone] Relation: Relative  Code Status:  DNR Goals of care: Advanced Directive information    08/05/2021   12:04 PM  Advanced Directives  Does Patient Have a Medical Advance Directive? No  Would patient like information on creating a medical advance directive? No - Patient declined     Chief Complaint  Patient presents with  . Medication Management    Review of all medications     HPI:  Pt is a 83 y.o. female seen today for an acute visit for medication review  Adjustment disorder with anxiety, not taking psychotropic meds.  Cognitive disorder, Hx of Donepezil  use. MRI brain, showed Generalized volume loss and findings of chronic microvascular ischemia  Vit D deficiency, Vit D 17 06/10/21    Past Medical History:  Diagnosis Date  . B12 deficiency    Past Surgical History:  Procedure Laterality Date  . NO PAST SURGERIES      No Known Allergies  Outpatient Encounter Medications as of 09/06/2024  Medication Sig  . Cholecalciferol 50 MCG (2000 UT) TABS Take 2,000 Units by mouth daily. (Patient not taking: Reported on 09/06/2024)  . [DISCONTINUED] Cyanocobalamin  (VITAMIN B-12 IJ) Inject as directed. Gets an injection once a month (Patient not taking: Reported on 09/06/2024)  . [DISCONTINUED] donepezil  (ARICEPT ) 5 MG tablet Take 1 tablet (5 mg total) by mouth at bedtime. If patient has no side effects can increase to 10mg  at bedtime in 2-4 weeks per personnel at [facility] or her primary care Dr. Okey. (Patient not taking: Reported on 09/06/2024)   No facility-administered encounter medications on file as of 09/06/2024.    Review of Systems  Constitutional:   Negative for activity change, appetite change and fatigue.  HENT:  Negative for congestion, trouble swallowing and voice change.   Eyes:  Negative for visual disturbance.  Respiratory:  Negative for cough and shortness of breath.   Cardiovascular:  Negative for leg swelling.  Gastrointestinal:  Negative for abdominal pain and constipation.  Genitourinary:  Negative for dysuria and frequency.  Musculoskeletal:  Negative for arthralgias and gait problem.  Skin:  Negative for color change.  Neurological:  Negative for speech difficulty, weakness and headaches.  Psychiatric/Behavioral:  Negative for behavioral problems, confusion and sleep disturbance. The patient is not nervous/anxious.     Immunization History  Administered Date(s) Administered  . INFLUENZA, HIGH DOSE SEASONAL PF 09/03/2018  . Influenza-Unspecified 10/11/2022  . Pfizer Covid-19 Vaccine Bivalent Booster 21yrs & up 10/21/2022   Pertinent  Health Maintenance Due  Topic Date Due  . DEXA SCAN  Never done  . Influenza Vaccine  07/20/2024      06/10/2021    8:53 AM  Fall Risk  Falls in the past year? 0   Functional Status Survey:    Vitals:   09/06/24 1019 09/06/24 1027  BP: (S) (!) 144/74 138/70  Pulse: 93   Resp: 19   Temp: 97.9 F (36.6 C)   SpO2: 97%   Weight: 152 lb 3.2 oz (69 kg)   Height: 5' 2 (1.575 m)    Body mass index is 27.84 kg/m. Physical Exam Vitals and nursing note reviewed.  Constitutional:      Appearance: Normal appearance.  HENT:  Head: Normocephalic and atraumatic.     Nose: Nose normal.     Mouth/Throat:     Mouth: Mucous membranes are moist.  Eyes:     Extraocular Movements: Extraocular movements intact.     Conjunctiva/sclera: Conjunctivae normal.     Pupils: Pupils are equal, round, and reactive to light.  Cardiovascular:     Rate and Rhythm: Normal rate and regular rhythm.     Heart sounds: No murmur heard. Pulmonary:     Effort: Pulmonary effort is normal.      Breath sounds: Rhonchi present. No wheezing or rales.  Abdominal:     General: Bowel sounds are normal.     Palpations: Abdomen is soft.     Tenderness: There is no abdominal tenderness. There is no guarding or rebound.  Musculoskeletal:     Cervical back: Normal range of motion and neck supple.     Right lower leg: No edema.     Left lower leg: No edema.  Skin:    General: Skin is warm and dry.  Neurological:     General: No focal deficit present.     Mental Status: She is alert and oriented to person, place, and time. Mental status is at baseline.     Motor: No weakness.     Coordination: Coordination normal.     Gait: Gait normal.  Psychiatric:        Mood and Affect: Mood normal.        Behavior: Behavior normal.        Thought Content: Thought content normal.        Judgment: Judgment normal.     Labs reviewed: No results for input(s): NA, K, CL, CO2, GLUCOSE, BUN, CREATININE, CALCIUM, MG, PHOS in the last 8760 hours. No results for input(s): AST, ALT, ALKPHOS, BILITOT, PROT, ALBUMIN in the last 8760 hours. No results for input(s): WBC, NEUTROABS, HGB, HCT, MCV, PLT in the last 8760 hours. Lab Results  Component Value Date   TSH 1.440 06/10/2021   No results found for: HGBA1C No results found for: CHOL, HDL, LDLCALC, LDLDIRECT, TRIG, CHOLHDL  Alzheimer disease (HCC) Hx of Donepezil  use. MRI brain, showed Generalized volume loss and findings of chronic microvascular Ischemia CBC/diff, CMP/eGFR, TSH, Hgb A1c, lipids, Vit D, Vit B12, MMSE  Vitamin D  deficiency Repeat Vit D level.   Anxiety Doing better in AL [facility]    Family/ staff Communication: plan of care reviewed with the patient and charge nurse.   Labs/tests ordered:  CBC/diff, CMP/eGFR, TSH, Hgb A1c, lipids, Vit D, Vit B12

## 2024-09-06 NOTE — Assessment & Plan Note (Signed)
 Doing better in AL [facility]

## 2024-09-06 NOTE — Assessment & Plan Note (Signed)
 Hx of Donepezil  use. MRI brain, showed Generalized volume loss and findings of chronic microvascular Ischemia CBC/diff, CMP/eGFR, TSH, Hgb A1c, lipids, Vit D, Vit B12, MMSE

## 2024-09-10 ENCOUNTER — Encounter: Payer: Self-pay | Admitting: Nurse Practitioner

## 2024-09-11 DIAGNOSIS — F4322 Adjustment disorder with anxiety: Secondary | ICD-10-CM | POA: Diagnosis not present

## 2024-09-11 DIAGNOSIS — D519 Vitamin B12 deficiency anemia, unspecified: Secondary | ICD-10-CM | POA: Diagnosis not present

## 2024-09-11 DIAGNOSIS — E559 Vitamin D deficiency, unspecified: Secondary | ICD-10-CM | POA: Diagnosis not present

## 2024-09-11 DIAGNOSIS — E119 Type 2 diabetes mellitus without complications: Secondary | ICD-10-CM | POA: Diagnosis not present

## 2024-09-12 ENCOUNTER — Encounter: Payer: Self-pay | Admitting: Nurse Practitioner

## 2024-09-12 DIAGNOSIS — E538 Deficiency of other specified B group vitamins: Secondary | ICD-10-CM | POA: Insufficient documentation

## 2024-10-02 ENCOUNTER — Other Ambulatory Visit: Payer: Self-pay | Admitting: Nurse Practitioner
